# Patient Record
Sex: Female | Born: 1975 | Race: White | Hispanic: No | Marital: Married | Smoking: Never smoker
Health system: Southern US, Community
[De-identification: ages and names within clinical notes are randomized; demographics above are authoritative.]

## PROBLEM LIST (undated history)

## (undated) DIAGNOSIS — O09529 Supervision of elderly multigravida, unspecified trimester: Secondary | ICD-10-CM

## (undated) DIAGNOSIS — R51 Headache: Secondary | ICD-10-CM

## (undated) HISTORY — DX: Supervision of elderly multigravida, unspecified trimester: O09.529

## (undated) HISTORY — DX: Headache: R51

## (undated) HISTORY — PX: BLADDER SURGERY: SHX569

## (undated) HISTORY — PX: DILATION AND CURETTAGE OF UTERUS: SHX78

---

## 2004-03-10 ENCOUNTER — Ambulatory Visit (HOSPITAL_COMMUNITY): Admission: RE | Admit: 2004-03-10 | Discharge: 2004-03-10 | Payer: Self-pay | Admitting: Chiropractic Medicine

## 2005-07-11 ENCOUNTER — Other Ambulatory Visit: Admission: RE | Admit: 2005-07-11 | Discharge: 2005-07-11 | Payer: Self-pay | Admitting: Obstetrics and Gynecology

## 2010-12-19 NOTE — L&D Delivery Note (Signed)
Delivery Note At 12:08 PM a viable female was delivered via  (Presentation: ;  ).  APGAR8/9: , ; weight .   Placenta status: intact, .3 vessel  Cord:  with the following complications: .    Anesthesia:  local Episiotomy: none Lacerations: second degree Suture Repair: chromic Est. Blood Loss (mL): 400 cc  Mom to postpartum.  Baby to nursery-stable.  Sue Yoder S 12/16/2011, 12:21 PM

## 2011-05-04 LAB — HIV ANTIBODY (ROUTINE TESTING W REFLEX): HIV: NONREACTIVE

## 2011-05-04 LAB — ANTIBODY SCREEN: Antibody Screen: NEGATIVE

## 2011-05-04 LAB — GC/CHLAMYDIA PROBE AMP, GENITAL: Chlamydia: NEGATIVE

## 2011-05-04 LAB — ABO/RH: RH Type: POSITIVE

## 2011-12-08 ENCOUNTER — Encounter (HOSPITAL_COMMUNITY): Payer: Self-pay | Admitting: *Deleted

## 2011-12-08 ENCOUNTER — Telehealth (HOSPITAL_COMMUNITY): Payer: Self-pay | Admitting: *Deleted

## 2011-12-08 NOTE — Telephone Encounter (Signed)
Preadmission screen  

## 2011-12-14 ENCOUNTER — Encounter (HOSPITAL_COMMUNITY): Payer: Self-pay | Admitting: *Deleted

## 2011-12-14 ENCOUNTER — Inpatient Hospital Stay (HOSPITAL_COMMUNITY): Admit: 2011-12-14 | Payer: Self-pay | Admitting: Obstetrics and Gynecology

## 2011-12-15 ENCOUNTER — Encounter (HOSPITAL_COMMUNITY): Payer: Self-pay

## 2011-12-15 ENCOUNTER — Inpatient Hospital Stay (HOSPITAL_COMMUNITY)
Admission: RE | Admit: 2011-12-15 | Discharge: 2011-12-17 | DRG: 373 | Disposition: A | Discharge status: Home / Self Care | Payer: BC Managed Care – PPO | Source: Ambulatory Visit | Attending: Obstetrics and Gynecology | Admitting: Obstetrics and Gynecology

## 2011-12-15 DIAGNOSIS — O09529 Supervision of elderly multigravida, unspecified trimester: Secondary | ICD-10-CM | POA: Diagnosis present

## 2011-12-15 LAB — CBC
Hemoglobin: 11.3 g/dL — ABNORMAL LOW (ref 12.0–15.0)
RBC: 3.68 MIL/uL — ABNORMAL LOW (ref 3.87–5.11)

## 2011-12-15 MED ORDER — ONDANSETRON HCL 4 MG/2ML IJ SOLN: 4.0000 mg | Freq: Four times a day (QID) | INTRAMUSCULAR | Status: DC | PRN

## 2011-12-15 MED ORDER — TERBUTALINE SULFATE 1 MG/ML IJ SOLN: 0.2500 mg | Freq: Once | INTRAMUSCULAR | Status: AC | PRN

## 2011-12-15 MED ORDER — IBUPROFEN 600 MG PO TABS: 600.0000 mg | ORAL_TABLET | Freq: Four times a day (QID) | ORAL | Status: DC | PRN

## 2011-12-15 MED ORDER — LACTATED RINGERS IV SOLN: 500.0000 mL | INTRAVENOUS | Status: DC | PRN

## 2011-12-15 MED ORDER — LIDOCAINE HCL (PF) 1 % IJ SOLN
30.0000 mL | INTRAMUSCULAR | Status: DC | PRN
  Filled 2011-12-15: qty 30

## 2011-12-15 MED ORDER — OXYTOCIN 20 UNITS IN LACTATED RINGERS INFUSION - SIMPLE
125.0000 mL/h | Freq: Once | INTRAVENOUS | Status: AC
  Administered 2011-12-16: 125 mL/h via INTRAVENOUS

## 2011-12-15 MED ORDER — ACETAMINOPHEN 325 MG PO TABS: 650.0000 mg | ORAL_TABLET | ORAL | Status: DC | PRN

## 2011-12-15 MED ORDER — DINOPROSTONE 10 MG VA INST
10.0000 mg | VAGINAL_INSERT | Freq: Once | VAGINAL | Status: AC
  Administered 2011-12-15: 10 mg via VAGINAL
  Filled 2011-12-15: qty 1

## 2011-12-15 MED ORDER — LACTATED RINGERS IV SOLN
INTRAVENOUS | Status: DC
  Administered 2011-12-15 – 2011-12-16 (×2): via INTRAVENOUS

## 2011-12-15 MED ORDER — CITRIC ACID-SODIUM CITRATE 334-500 MG/5ML PO SOLN: 30.0000 mL | ORAL | Status: DC | PRN

## 2011-12-15 MED ORDER — FLEET ENEMA 7-19 GM/118ML RE ENEM: 1.0000 | ENEMA | RECTAL | Status: DC | PRN

## 2011-12-15 MED ORDER — ZOLPIDEM TARTRATE 10 MG PO TABS: 10.0000 mg | ORAL_TABLET | Freq: Every evening | ORAL | Status: DC | PRN

## 2011-12-15 MED ORDER — OXYTOCIN BOLUS FROM INFUSION
500.0000 mL | Freq: Once | INTRAVENOUS | Status: DC
  Filled 2011-12-15: qty 500

## 2011-12-15 MED ORDER — OXYCODONE-ACETAMINOPHEN 5-325 MG PO TABS: 2.0000 | ORAL_TABLET | ORAL | Status: DC | PRN

## 2011-12-15 NOTE — Progress Notes (Signed)
Made aware of pt status: uterine contraction pattern, FHT tracing, SVE and need for orders. Provider will put in orders. WIll continue to monitor.

## 2011-12-16 ENCOUNTER — Encounter (HOSPITAL_COMMUNITY): Payer: Self-pay

## 2011-12-16 LAB — RPR: RPR Ser Ql: NONREACTIVE

## 2011-12-16 LAB — RUBELLA SCREEN: Rubella: 164.1 IU/mL — ABNORMAL HIGH

## 2011-12-16 MED ORDER — BUTORPHANOL TARTRATE 2 MG/ML IJ SOLN
2.0000 mg | Freq: Once | INTRAMUSCULAR | Status: AC
  Administered 2011-12-16: 2 mg via INTRAVENOUS
  Filled 2011-12-16: qty 1

## 2011-12-16 MED ORDER — SENNOSIDES-DOCUSATE SODIUM 8.6-50 MG PO TABS
2.0000 | ORAL_TABLET | Freq: Every day | ORAL | Status: DC
  Administered 2011-12-16: 2 via ORAL

## 2011-12-16 MED ORDER — SIMETHICONE 80 MG PO CHEW: 80.0000 mg | CHEWABLE_TABLET | ORAL | Status: DC | PRN

## 2011-12-16 MED ORDER — OXYTOCIN 20 UNITS IN LACTATED RINGERS INFUSION - SIMPLE
1.0000 m[IU]/min | INTRAVENOUS | Status: DC
  Administered 2011-12-16: 2 m[IU]/min via INTRAVENOUS
  Filled 2011-12-16: qty 1000

## 2011-12-16 MED ORDER — BENZOCAINE-MENTHOL 20-0.5 % EX AERO
1.0000 "application " | INHALATION_SPRAY | CUTANEOUS | Status: DC | PRN
  Administered 2011-12-16: 1 via TOPICAL

## 2011-12-16 MED ORDER — ONDANSETRON HCL 4 MG/2ML IJ SOLN: 4.0000 mg | INTRAMUSCULAR | Status: DC | PRN

## 2011-12-16 MED ORDER — TERBUTALINE SULFATE 1 MG/ML IJ SOLN: 0.2500 mg | Freq: Once | INTRAMUSCULAR | Status: DC | PRN

## 2011-12-16 MED ORDER — TETANUS-DIPHTH-ACELL PERTUSSIS 5-2.5-18.5 LF-MCG/0.5 IM SUSP: 0.5000 mL | Freq: Once | INTRAMUSCULAR | Status: DC

## 2011-12-16 MED ORDER — FLEET ENEMA 7-19 GM/118ML RE ENEM: 1.0000 | ENEMA | Freq: Every day | RECTAL | Status: DC | PRN

## 2011-12-16 MED ORDER — PROMETHAZINE HCL 25 MG/ML IJ SOLN
12.5000 mg | Freq: Once | INTRAMUSCULAR | Status: AC
  Administered 2011-12-16: 12.5 mg via INTRAVENOUS
  Filled 2011-12-16: qty 1

## 2011-12-16 MED ORDER — LANOLIN HYDROUS EX OINT: TOPICAL_OINTMENT | CUTANEOUS | Status: DC | PRN

## 2011-12-16 MED ORDER — OXYCODONE-ACETAMINOPHEN 5-325 MG PO TABS: 1.0000 | ORAL_TABLET | ORAL | Status: DC | PRN

## 2011-12-16 MED ORDER — ZOLPIDEM TARTRATE 5 MG PO TABS: 5.0000 mg | ORAL_TABLET | Freq: Every evening | ORAL | Status: DC | PRN

## 2011-12-16 MED ORDER — IBUPROFEN 600 MG PO TABS
600.0000 mg | ORAL_TABLET | Freq: Four times a day (QID) | ORAL | Status: DC
  Administered 2011-12-16 – 2011-12-17 (×4): 600 mg via ORAL
  Filled 2011-12-16 (×4): qty 1

## 2011-12-16 MED ORDER — WITCH HAZEL-GLYCERIN EX PADS
1.0000 "application " | MEDICATED_PAD | CUTANEOUS | Status: DC | PRN
  Administered 2011-12-16: 1 via TOPICAL

## 2011-12-16 MED ORDER — DIBUCAINE 1 % RE OINT: 1.0000 "application " | TOPICAL_OINTMENT | RECTAL | Status: DC | PRN

## 2011-12-16 MED ORDER — PRENATAL MULTIVITAMIN CH
1.0000 | ORAL_TABLET | Freq: Every day | ORAL | Status: DC
  Administered 2011-12-16 – 2011-12-17 (×2): 1 via ORAL
  Filled 2011-12-16 (×2): qty 1

## 2011-12-16 MED ORDER — BISACODYL 10 MG RE SUPP: 10.0000 mg | Freq: Every day | RECTAL | Status: DC | PRN

## 2011-12-16 MED ORDER — ONDANSETRON HCL 4 MG PO TABS: 4.0000 mg | ORAL_TABLET | ORAL | Status: DC | PRN

## 2011-12-16 MED ORDER — BUTORPHANOL TARTRATE 2 MG/ML IJ SOLN
1.0000 mg | Freq: Once | INTRAMUSCULAR | Status: AC
  Administered 2011-12-16: 1 mg via INTRAVENOUS
  Filled 2011-12-16: qty 1

## 2011-12-16 MED ORDER — BENZOCAINE-MENTHOL 20-0.5 % EX AERO
INHALATION_SPRAY | CUTANEOUS | Status: AC
  Administered 2011-12-16: 1 via TOPICAL
  Filled 2011-12-16: qty 56

## 2011-12-16 MED ORDER — DIPHENHYDRAMINE HCL 25 MG PO CAPS: 25.0000 mg | ORAL_CAPSULE | Freq: Four times a day (QID) | ORAL | Status: DC | PRN

## 2011-12-16 NOTE — Progress Notes (Signed)
MD made aware of pt status: uterine contractions, FHT tracing, SVE and cervidil in place. New order given. Will continue to monitor.

## 2011-12-16 NOTE — H&P (Signed)
Sue Yoder is a 35 y.o. female presenting at 18.2 for induction.  cervidyl last pm.  GBS neg. Maternal Medical History:  Prenatal Complications - Diabetes: none.    OB History    Grav Para Term Preterm Abortions TAB SAB Ect Mult Living   4 1 1  2  2   1      Past Medical History  Diagnosis Date  . Headache   . AMA (advanced maternal age) multigravida 35+    Past Surgical History  Procedure Date  . Dilation and curettage of uterus    Family History: family history includes Anemia in her maternal grandmother; Cancer in her father; and Heart disease in her mother. Social History:  reports that she has never smoked. She has never used smokeless tobacco. She reports that she does not drink alcohol or use illicit drugs.  ROS  Dilation: 4 Effacement (%): 100 Station: -1 Exam by:: Hannah Beat Blood pressure 106/54, pulse 83, temperature 98.3 F (36.8 C), temperature source Oral, resp. rate 18, height 5\' 7"  (1.702 m), weight 81.194 kg (179 lb), last menstrual period 03/09/2011. Maternal Exam:  Uterine Assessment: Contraction strength is moderate.  Contraction frequency is regular.   Abdomen: Patient reports no abdominal tenderness. Fundal height is c/w dates.   Estimated fetal weight is 7.   Fetal presentation: vertex  Introitus: Amniotic fluid character: clear.  Pelvis: adequate for delivery.   Cervix: Cervix evaluated by digital exam.     Physical Exam  Prenatal labs: ABO, Rh: O/Positive/-- (05/16 0000) Antibody: Negative (05/16 0000) Rubella: 164.1 (12/27 2010) RPR: NON REACTIVE (12/27 2010)  HBsAg: Negative (05/16 0000)  HIV: Non-reactive (05/16 0000)  GBS: Negative (11/28 0000)   Assessment/Plan:IUP at 40 + for inducion.  AROM willl follow ctx pattern   Sher Hellinger S 12/16/2011, 7:36 AM

## 2011-12-16 NOTE — Progress Notes (Signed)
UR Chart review completed.  

## 2011-12-16 NOTE — Progress Notes (Signed)
Patient ID: Sue Yoder, female   DOB: July 04, 1976, 35 y.o.   MRN: [id number] Mild irreg ctx's begin pitocin risk discussed

## 2011-12-17 LAB — CBC
Hemoglobin: 9.8 g/dL — ABNORMAL LOW (ref 12.0–15.0)
MCHC: 33 g/dL (ref 30.0–36.0)
RDW: 12.7 % (ref 11.5–15.5)
WBC: 11.4 10*3/uL — ABNORMAL HIGH (ref 4.0–10.5)

## 2011-12-17 MED ORDER — PRENATAL MULTIVITAMIN CH: 1.0000 | ORAL_TABLET | Freq: Every day | ORAL | Status: DC

## 2011-12-17 NOTE — Progress Notes (Signed)
Post Partum Day one Subjective: no complaints  Objective: Blood pressure 86/48, pulse 70, temperature 98.8 F (37.1 C), temperature source Oral, resp. rate 20, height 5\' 7"  (1.702 m), weight 81.194 kg (179 lb), last menstrual period 03/09/2011, unknown if currently breastfeeding.  Physical Exam:  General: alert Lochia: appropriate Uterine Fundus: firm Incision: healing well DVT Evaluation: No evidence of DVT seen on physical exam.   Basename 12/17/11 0530 12/15/11 2010  HGB 9.8* 11.3*  HCT 29.7* 33.4*    Assessment/Plan: Discharge home   LOS: 2 days   Sue Yoder S 12/17/2011, 9:21 AM

## 2011-12-17 NOTE — Discharge Summary (Signed)
Obstetric Discharge Summary Reason for Admission: induction of labor Prenatal Procedures:  ultrasouds Intrapartum Procedures: spontaneous vaginal delivery Postpartum Procedures: none Complications-Operative and Postpartum: second degree perineal laceration Hemoglobin  Date Value Range Status  12/17/2011 9.8* 12.0-15.0 (g/dL) Final     HCT  Date Value Range Status  12/17/2011 29.7* 36.0-46.0 (%) Final    Discharge Diagnoses: Term Pregnancy-delivered  Discharge Information: Date: 12/17/2011 Activity: pelvic rest Diet: routine Medications: PNV Condition: stable Instructions: refer to practice specific booklet Discharge to: home   Newborn Data: Live born female  Birth Weight: 7 lb 15.5 oz (3615 g) APGAR: 9, 9  Home with mother.  Farren Landa S 12/17/2011, 9:21 AM

## 2012-01-27 ENCOUNTER — Ambulatory Visit (HOSPITAL_COMMUNITY)
Admission: RE | Admit: 2012-01-27 | Discharge: 2012-01-27 | Disposition: A | Discharge status: Home / Self Care | Payer: BC Managed Care – PPO | Source: Ambulatory Visit | Attending: Obstetrics and Gynecology | Admitting: Obstetrics and Gynecology

## 2012-01-27 NOTE — Progress Notes (Signed)
Adult Lactation Consultation Outpatient Visit Note  Patient Name: Sue Yoder Date of Birth: March 24, 1976 Gestational Age at Delivery: Unknown Type of Delivery:   Patient is here today related to thrush. She has been using all purpose nipple cream for about three weeks and now she reports shooting pain in her breasts bilaterally that is radiating out.  Her breasts are also throbbing 2 times a day and her nipples are bright pink and burn.  Dr. Rana Snare prescribed diflucan for her today x 7 days.  She denies any nipple discoloration after feeding.  There is minimal pain with latching that resolves quickly.  Advised to start the diflucan and start using sore nipple shells.  Thrush care plan was initiated.  She will notify healthcare provider if there is no change in symptoms in 5 days.  If she does notice an improvement but the pain is present in 7 days she will also follow up with her doctor.    Amount Transferred: Comments:     Follow-Up      Sue Yoder 01/27/2012, 4:07 PM

## 2014-10-20 ENCOUNTER — Encounter (HOSPITAL_COMMUNITY): Payer: Self-pay

## 2017-08-29 ENCOUNTER — Encounter: Payer: Self-pay | Admitting: Allergy and Immunology

## 2017-08-29 ENCOUNTER — Ambulatory Visit (INDEPENDENT_AMBULATORY_CARE_PROVIDER_SITE_OTHER): Payer: 59 | Admitting: Allergy and Immunology

## 2017-08-29 VITALS — BP 108/72 | HR 75 | Temp 98.2°F | Resp 18 | Ht 65.5 in | Wt 137.4 lb

## 2017-08-29 DIAGNOSIS — L500 Allergic urticaria: Secondary | ICD-10-CM | POA: Insufficient documentation

## 2017-08-29 DIAGNOSIS — Z889 Allergy status to unspecified drugs, medicaments and biological substances status: Secondary | ICD-10-CM

## 2017-08-29 NOTE — Patient Instructions (Addendum)
Urticaria/drug reaction Drug reaction from ciprofloxacin is most likely based upon the timing of symptom onset. Skin tests to select environmental and food allergens were negative today. NSAIDs and emotional stress commonly exacerbate urticaria but are not the underlying etiology in this case. Physical urticarias are negative by history (i.e. pressure-induced, temperature, vibration, solar, etc.). There are no concomitant symptoms concerning for anaphylaxis.   We will not order labs at this time, however, if lesions recur, persist, progress, or change in character, we will assess potential etiologies with screening labs.  For symptom relief, patient is to take oral antihistamines as directed.  For now, avoid ciprofloxacin another fluoroquinolones for now.  Should symptoms recur in the absence of ciprofloxacin or other floor quinolones, a journal is to be kept recording any foods eaten, beverages consumed, medications taken within a 6 hour period prior to the onset of symptoms, as well as record activities being performed, and environmental conditions. For any symptoms concerning for anaphylaxis, 911 is to be called immediately.   Return if symptoms worsen or fail to improve.

## 2017-08-29 NOTE — Assessment & Plan Note (Signed)
Drug reaction from ciprofloxacin is most likely based upon the timing of symptom onset. Skin tests to select environmental and food allergens were negative today. NSAIDs and emotional stress commonly exacerbate urticaria but are not the underlying etiology in this case. Physical urticarias are negative by history (i.e. pressure-induced, temperature, vibration, solar, etc.). There are no concomitant symptoms concerning for anaphylaxis.   We will not order labs at this time, however, if lesions recur, persist, progress, or change in character, we will assess potential etiologies with screening labs.  For symptom relief, patient is to take oral antihistamines as directed.  For now, avoid ciprofloxacin another fluoroquinolones for now.  Should symptoms recur in the absence of ciprofloxacin or other floor quinolones, a journal is to be kept recording any foods eaten, beverages consumed, medications taken within a 6 hour period prior to the onset of symptoms, as well as record activities being performed, and environmental conditions. For any symptoms concerning for anaphylaxis, 911 is to be called immediately.

## 2017-08-29 NOTE — Progress Notes (Signed)
New Patient Note  RE: Sue Yoder MRN: [id number] DOB: [birth year] Date of Office Visit: 08/29/2017  Referring provider: Clayborn Heron, MD Primary care provider: Clayborn Heron, MD  Chief Complaint: Urticaria   History of present illness: Sue Yoder is a 41 y.o. female presenting today for evaluation of urticaria.  She reports that on 06/28/2017 she developed hives on her abdomen, flanks, and upper thighs.  The hives are described as red, raised, nonpruritic, and nonpainful.  2 days prior to the onset of hives she had gone in for a urodynamic study and received a dose of ciprofloxacin.  5 days prior to the onset of the hives she had returned from a trip to [place].  Otherwise, no specific medication, food, skin care product, detergent, soap, or other environmental triggers were identified.  She did not experience concomitant angioedema, cardiopulmonary symptoms, or GI symptoms.  She went to her primary care physician and was told to take oral antihistamines, however with the hives persisted she was started on a course of prednisone.  The hives resolved with prednisone, however approximately one week later recurred.  She was started on another antibiotic for possible urinary tract infection as well as cetirizine and the hives resolved over the course of the next week.  The hives have not recurred since that time.  Urine culture came back negative for bacteria.   Assessment and plan: Urticaria/drug reaction Drug reaction from ciprofloxacin is most likely based upon the timing of symptom onset. Skin tests to select environmental and food allergens were negative today. NSAIDs and emotional stress commonly exacerbate urticaria but are not the underlying etiology in this case. Physical urticarias are negative by history (i.e. pressure-induced, temperature, vibration, solar, etc.). There are no concomitant symptoms concerning for anaphylaxis.   We will not order labs at this time,  however, if lesions recur, persist, progress, or change in character, we will assess potential etiologies with screening labs.  For symptom relief, patient is to take oral antihistamines as directed.  For now, avoid ciprofloxacin another fluoroquinolones for now.  Should symptoms recur in the absence of ciprofloxacin or other floor quinolones, a journal is to be kept recording any foods eaten, beverages consumed, medications taken within a 6 hour period prior to the onset of symptoms, as well as record activities being performed, and environmental conditions. For any symptoms concerning for anaphylaxis, 911 is to be called immediately.   Diagnostics: Environmental skin testing:  Negative despite a positive histamine control. Food allergen skin testing:  Negative despite a positive histamine control.    Physical examination: Blood pressure 108/72, pulse 75, temperature 98.2 F (36.8 C), resp. rate 18, height 5' 5.5" (1.664 m), weight 137 lb 6.4 oz (62.3 kg), SpO2 98 %, unknown if currently breastfeeding.  General: Alert, interactive, in no acute distress. HEENT: TMs pearly gray, turbinates minimally edematous without discharge, post-pharynx unremarkable. Neck: Supple without lymphadenopathy. Lungs: Clear to auscultation without wheezing, rhonchi or rales. CV: Normal S1, S2 without murmurs. Abdomen: Nondistended, nontender. Skin: Warm and dry, without lesions or rashes. Extremities:  No clubbing, cyanosis or edema. Neuro:   Grossly intact.  Review of systems:  Review of systems negative except as noted in HPI / PMHx or noted below: Review of Systems  Constitutional: Negative.   HENT: Negative.   Eyes: Negative.   Respiratory: Negative.   Cardiovascular: Negative.   Gastrointestinal: Negative.   Genitourinary: Negative.   Musculoskeletal: Negative.   Skin: Negative.   Neurological: Negative.  Endo/Heme/Allergies: Negative.   Psychiatric/Behavioral: Negative.     Past medical  history:  Past Medical History:  Diagnosis Date  . AMA (advanced maternal age) multigravida 35+   . ZOXWRUEA(540.9Headache(784.0)     Past surgical history:  Past Surgical History:  Procedure Laterality Date  . DILATION AND CURETTAGE OF UTERUS      Family history: Family History  Problem Relation Age of Onset  . Heart disease Mother   . Cancer Father        prostate and esophageal  . Anemia Maternal Grandmother     Social history: Social History   Social History  . Marital status: Married    Spouse name: N/A  . Number of children: N/A  . Years of education: N/A   Occupational History  . Not on file.   Social History Main Topics  . Smoking status: Never Smoker  . Smokeless tobacco: Never Used  . Alcohol use No  . Drug use: No  . Sexual activity: Yes   Other Topics Concern  . Not on file   Social History Narrative  . No narrative on file   Environmental History: The patient lives in a 43 year old house with carpeting throughout, gas heat, and central air.  She is a nonsmoker without pets.  There is no known mold/water damage in the home.  Allergies as of 08/29/2017   No Known Allergies     Medication List       Accurate as of 08/29/17  2:54 PM. Always use your most recent med list.          acetaminophen 325 MG tablet Commonly known as:  TYLENOL Take 650 mg by mouth every 6 (six) hours as needed. Head ache   MYRBETRIQ PO Take by mouth.       Known medication allergies: No Known Allergies  I [facility] the opportunity [facility]. Please do not hesitate to contact me with questions.  Sincerely,   R. Jorene Guestarter Turki Tapanes, MD

## 2018-02-07 ENCOUNTER — Emergency Department (HOSPITAL_BASED_OUTPATIENT_CLINIC_OR_DEPARTMENT_OTHER): Payer: 59

## 2018-02-07 ENCOUNTER — Inpatient Hospital Stay (HOSPITAL_BASED_OUTPATIENT_CLINIC_OR_DEPARTMENT_OTHER)
Admission: EM | Admit: 2018-02-07 | Discharge: 2018-02-09 | DRG: 195 | Disposition: A | Discharge status: Home / Self Care | Payer: 59 | Attending: Family Medicine | Admitting: Family Medicine

## 2018-02-07 ENCOUNTER — Encounter (HOSPITAL_BASED_OUTPATIENT_CLINIC_OR_DEPARTMENT_OTHER): Payer: Self-pay | Admitting: Emergency Medicine

## 2018-02-07 ENCOUNTER — Other Ambulatory Visit: Payer: Self-pay

## 2018-02-07 DIAGNOSIS — J1100 Influenza due to unidentified influenza virus with unspecified type of pneumonia: Secondary | ICD-10-CM | POA: Diagnosis not present

## 2018-02-07 DIAGNOSIS — Z800 Family history of malignant neoplasm of digestive organs: Secondary | ICD-10-CM

## 2018-02-07 DIAGNOSIS — Z8042 Family history of malignant neoplasm of prostate: Secondary | ICD-10-CM

## 2018-02-07 DIAGNOSIS — J9601 Acute respiratory failure with hypoxia: Secondary | ICD-10-CM | POA: Diagnosis present

## 2018-02-07 DIAGNOSIS — Z881 Allergy status to other antibiotic agents status: Secondary | ICD-10-CM | POA: Diagnosis not present

## 2018-02-07 DIAGNOSIS — G43909 Migraine, unspecified, not intractable, without status migrainosus: Secondary | ICD-10-CM | POA: Diagnosis present

## 2018-02-07 DIAGNOSIS — Z883 Allergy status to other anti-infective agents status: Secondary | ICD-10-CM

## 2018-02-07 DIAGNOSIS — J181 Lobar pneumonia, unspecified organism: Secondary | ICD-10-CM | POA: Diagnosis present

## 2018-02-07 DIAGNOSIS — R0902 Hypoxemia: Secondary | ICD-10-CM | POA: Diagnosis present

## 2018-02-07 DIAGNOSIS — J189 Pneumonia, unspecified organism: Secondary | ICD-10-CM | POA: Diagnosis present

## 2018-02-07 LAB — CBC WITH DIFFERENTIAL/PLATELET
Basophils Absolute: 0 10*3/uL (ref 0.0–0.1)
Basophils Relative: 0 %
Eosinophils Absolute: 0.1 10*3/uL (ref 0.0–0.7)
Eosinophils Relative: 0 %
HEMATOCRIT: 33.1 % — AB (ref 36.0–46.0)
HEMOGLOBIN: 10.9 g/dL — AB (ref 12.0–15.0)
LYMPHS PCT: 7 %
Lymphs Abs: 1.1 10*3/uL (ref 0.7–4.0)
MCH: 29.8 pg (ref 26.0–34.0)
MCHC: 32.9 g/dL (ref 30.0–36.0)
MCV: 90.4 fL (ref 78.0–100.0)
MONOS PCT: 6 %
Monocytes Absolute: 0.9 10*3/uL (ref 0.1–1.0)
NEUTROS ABS: 12.1 10*3/uL — AB (ref 1.7–7.7)
NEUTROS PCT: 87 %
Platelets: 231 10*3/uL (ref 150–400)
RBC: 3.66 MIL/uL — ABNORMAL LOW (ref 3.87–5.11)
RDW: 11.7 % (ref 11.5–15.5)
WBC: 14.2 10*3/uL — ABNORMAL HIGH (ref 4.0–10.5)

## 2018-02-07 LAB — BASIC METABOLIC PANEL
ANION GAP: 10 (ref 5–15)
BUN: 12 mg/dL (ref 6–20)
CALCIUM: 8.6 mg/dL — AB (ref 8.9–10.3)
CHLORIDE: 101 mmol/L (ref 101–111)
CO2: 25 mmol/L (ref 22–32)
Creatinine, Ser: 0.8 mg/dL (ref 0.44–1.00)
GFR calc Af Amer: >60 mL/min (ref >60)
GFR calc non Af Amer: >60 mL/min (ref >60)
GLUCOSE: 103 mg/dL — AB (ref 65–99)
Potassium: 4.1 mmol/L (ref 3.5–5.1)
Sodium: 136 mmol/L (ref 135–145)

## 2018-02-07 LAB — I-STAT CG4 LACTIC ACID, ED: Lactic Acid, Venous: 0.54 mmol/L (ref 0.5–1.9)

## 2018-02-07 MED ORDER — SODIUM CHLORIDE 0.9 % IV BOLUS (SEPSIS)
1000.0000 mL | Freq: Once | INTRAVENOUS | Status: AC
  Administered 2018-02-07: 1000 mL via INTRAVENOUS

## 2018-02-07 MED ORDER — VANCOMYCIN HCL IN DEXTROSE 1-5 GM/200ML-% IV SOLN
1000.0000 mg | Freq: Once | INTRAVENOUS | Status: AC
  Administered 2018-02-07: 1000 mg via INTRAVENOUS
  Filled 2018-02-07: qty 200

## 2018-02-07 MED ORDER — DIPHENHYDRAMINE HCL 50 MG/ML IJ SOLN
25.0000 mg | Freq: Once | INTRAMUSCULAR | Status: AC
  Administered 2018-02-07: 25 mg via INTRAVENOUS
  Filled 2018-02-07: qty 1

## 2018-02-07 MED ORDER — ENOXAPARIN SODIUM 40 MG/0.4ML ~~LOC~~ SOLN
40.0000 mg | SUBCUTANEOUS | Status: DC
  Administered 2018-02-07 – 2018-02-08 (×2): 40 mg via SUBCUTANEOUS
  Filled 2018-02-07 (×2): qty 0.4

## 2018-02-07 MED ORDER — SODIUM CHLORIDE 0.9 % IV SOLN
2.0000 g | Freq: Once | INTRAVENOUS | Status: AC
  Administered 2018-02-07: 2 g via INTRAVENOUS
  Filled 2018-02-07: qty 20

## 2018-02-07 MED ORDER — SODIUM CHLORIDE 0.9 % IV SOLN
INTRAVENOUS | Status: DC
  Administered 2018-02-07 – 2018-02-09 (×3): via INTRAVENOUS

## 2018-02-07 MED ORDER — ACETAMINOPHEN 500 MG PO TABS
1000.0000 mg | ORAL_TABLET | Freq: Once | ORAL | Status: AC
  Administered 2018-02-07: 1000 mg via ORAL
  Filled 2018-02-07: qty 2

## 2018-02-07 MED ORDER — DOXYCYCLINE HYCLATE 100 MG PO TABS
100.0000 mg | ORAL_TABLET | Freq: Two times a day (BID) | ORAL | Status: DC
  Administered 2018-02-07 – 2018-02-09 (×4): 100 mg via ORAL
  Filled 2018-02-07 (×4): qty 1

## 2018-02-07 MED ORDER — SODIUM CHLORIDE 0.9 % IV SOLN
1.0000 g | INTRAVENOUS | Status: DC
  Administered 2018-02-08: 1 g via INTRAVENOUS
  Filled 2018-02-07 (×2): qty 10

## 2018-02-07 MED ORDER — PROCHLORPERAZINE EDISYLATE 5 MG/ML IJ SOLN
10.0000 mg | Freq: Once | INTRAMUSCULAR | Status: AC
  Administered 2018-02-07: 10 mg via INTRAVENOUS
  Filled 2018-02-07: qty 2

## 2018-02-07 MED ORDER — DEXAMETHASONE SODIUM PHOSPHATE 10 MG/ML IJ SOLN
10.0000 mg | Freq: Once | INTRAMUSCULAR | Status: AC
  Administered 2018-02-07: 10 mg via INTRAVENOUS
  Filled 2018-02-07: qty 1

## 2018-02-07 MED ORDER — KETOROLAC TROMETHAMINE 15 MG/ML IJ SOLN
15.0000 mg | Freq: Once | INTRAMUSCULAR | Status: AC
  Administered 2018-02-07: 15 mg via INTRAVENOUS
  Filled 2018-02-07: qty 1

## 2018-02-07 MED ORDER — PREDNISONE 20 MG PO TABS
40.0000 mg | ORAL_TABLET | Freq: Every day | ORAL | Status: DC
  Administered 2018-02-08 – 2018-02-09 (×2): 40 mg via ORAL
  Filled 2018-02-07 (×2): qty 2

## 2018-02-07 MED ORDER — ACETAMINOPHEN 325 MG PO TABS
650.0000 mg | ORAL_TABLET | Freq: Four times a day (QID) | ORAL | Status: DC | PRN
  Administered 2018-02-08 (×3): 650 mg via ORAL
  Filled 2018-02-07 (×3): qty 2

## 2018-02-07 NOTE — ED Provider Notes (Signed)
[facility] EMERGENCY DEPARTMENT Provider Note   CSN: [id number] Arrival date & time: 02/07/18  0906     History   Chief Complaint Chief Complaint  Patient presents with  . Influenza    HPI Sue Yoder is a 42 y.o. female.  HPI   42 year old female here with weakness and cough.  The patient was recent diagnosed with influenza last week.  She completed a course of Tamiflu.  She states that over the last several days, she is developed recurrence of fevers, cough, nausea, and fatigue.  She had difficulty eating and drinking.  She said associated moderate, aching, throbbing headache.  She has a history of migraines and this is not atypical for her.  She has had progressive worsening shortness of breath as well.  She subsequent presents for evaluation.  She was placed on azithromycin several days ago by her PCP, but this has not improved her symptoms.  Denies any abdominal pain.  Denies any neck stiffness or rigidity.  No photophobia.  Past Medical History:  Diagnosis Date  . AMA (advanced maternal age) multigravida 35+   . ONGEXBMW(413.2Headache(784.0)     Patient Active Problem List   Diagnosis Date Noted  . PNA (pneumonia) 02/07/2018  . Urticaria/drug reaction 08/29/2017  . Drug allergy 08/29/2017    Past Surgical History:  Procedure Laterality Date  . BLADDER SURGERY    . DILATION AND CURETTAGE OF UTERUS      OB History    Gravida Para Term Preterm AB Living   4 2 2   2 2    SAB TAB Ectopic Multiple Live Births   2       2       Home Medications    Prior to Admission medications   Medication Sig Start Date End Date Taking? Authorizing Provider  azithromycin (ZITHROMAX) 250 MG tablet Take 250 mg by mouth daily.   Yes [provider]  ondansetron (ZOFRAN) 4 MG tablet Take 4 mg by mouth every 8 (eight) hours as needed for nausea or vomiting.   Yes [provider]  acetaminophen (TYLENOL) 325 MG tablet Take 650 mg by mouth every 6 (six) hours as  needed. Head ache     [provider]  Mirabegron (MYRBETRIQ PO) Take by mouth.    [provider]    Family History Family History  Problem Relation Age of Onset  . Heart disease Mother   . Cancer Father        prostate and esophageal  . Anemia Maternal Grandmother     Social History Social History   Tobacco Use  . Smoking status: Never Smoker  . Smokeless tobacco: Never Used  Substance Use Topics  . Alcohol use: No  . Drug use: No     Allergies   Ciprofloxacin; Erythromycin; and Septra [sulfamethoxazole-trimethoprim]   Review of Systems Review of Systems  Constitutional: Positive for chills and fever.  Respiratory: Positive for cough and shortness of breath.   Neurological: Positive for weakness.  All other systems reviewed and are negative.    Physical Exam Updated Vital Signs BP 97/73 (BP Location: Left Arm)   Pulse 84   Temp 98.7 F (37.1 C) (Oral)   Resp 18   Ht 5\' 7"  (1.702 m)   Wt 59.4 kg (131 lb)   LMP 01/23/2018   SpO2 96%   BMI 20.52 kg/m   Physical Exam  Constitutional: She is oriented to person, place, and time. She appears well-developed and  well-nourished. No distress.  HENT:  Head: Normocephalic and atraumatic.  Eyes: Conjunctivae are normal. Pupils are equal, round, and reactive to light.  Neck: Neck supple.  Cardiovascular: Normal rate, regular rhythm and normal heart sounds. Exam reveals no friction rub.  No murmur heard. Pulmonary/Chest: Effort normal. No respiratory distress. She has no wheezes. She has rales (Right basilar).  Abdominal: She exhibits no distension.  Musculoskeletal: She exhibits no edema.  Neurological: She is alert and oriented to person, place, and time. She exhibits normal muscle tone.  Skin: Skin is warm. Capillary refill takes less than 2 seconds.  Psychiatric: She has a normal mood and affect.  Nursing note and vitals reviewed.    ED Treatments / Results  Labs (all labs ordered are  listed, but only abnormal results are displayed) Labs Reviewed  CBC WITH DIFFERENTIAL/PLATELET - Abnormal; Notable for the following components:      Result Value   WBC 14.2 (*)    RBC 3.66 (*)    Hemoglobin 10.9 (*)    HCT 33.1 (*)    Neutro Abs 12.1 (*)    All other components within normal limits  BASIC METABOLIC PANEL - Abnormal; Notable for the following components:   Glucose, Bld 103 (*)    Calcium 8.6 (*)    All other components within normal limits  CULTURE, BLOOD (ROUTINE X 2)  CULTURE, BLOOD (ROUTINE X 2)  LACTIC ACID, PLASMA  LACTIC ACID, PLASMA  I-STAT CG4 LACTIC ACID, ED    EKG  EKG Interpretation None       Radiology Dg Chest 2 View  Result Date: 02/07/2018 CLINICAL DATA:  Cough. EXAM: CHEST  2 VIEW COMPARISON:  None. FINDINGS: Peripheral airspace opacity in the right lower lobe noted. The lungs appear otherwise clear. Cardiac and mediastinal margins appear normal. No bony abnormality observed. IMPRESSION: 1. Right lower lobe airspace opacity is presumably pneumonia given the patient's history. Followup PA and lateral chest X-ray is recommended in 3-4 weeks following trial of antibiotic therapy to ensure resolution and exclude underlying malignancy. Electronically Signed   By: Gaylyn Rong M.D.   On: 02/07/2018 10:57    Procedures Procedures (including critical care time)  Medications Ordered in ED Medications  sodium chloride 0.9 % bolus 1,000 mL (1,000 mLs Intravenous New Bag/Given 02/07/18 1500)  vancomycin (VANCOCIN) IVPB 1000 mg/200 mL premix (1,000 mg Intravenous New Bag/Given 02/07/18 1453)  sodium chloride 0.9 % bolus 1,000 mL (0 mLs Intravenous Stopped 02/07/18 1230)  prochlorperazine (COMPAZINE) injection 10 mg (10 mg Intravenous Given 02/07/18 1034)  diphenhydrAMINE (BENADRYL) injection 25 mg (25 mg Intravenous Given 02/07/18 1035)  ketorolac (TORADOL) 15 MG/ML injection 15 mg (15 mg Intravenous Given 02/07/18 1035)  cefTRIAXone (ROCEPHIN) 2 g in  sodium chloride 0.9 % 100 mL IVPB (0 g Intravenous Stopped 02/07/18 1230)  dexamethasone (DECADRON) injection 10 mg (10 mg Intravenous Given 02/07/18 1154)  acetaminophen (TYLENOL) tablet 1,000 mg (1,000 mg Oral Given 02/07/18 1158)     Initial Impression / Assessment and Plan / ED Course  I have reviewed the triage vital signs and the nursing notes.  Pertinent labs & imaging results that were available during my care of the patient were reviewed by me and considered in my medical decision making (see chart for details).     42 year old female here with headache, generalized weakness, cough, and fatigue after recent influenza.  Patient x-ray shows right middle lobe pneumonia.  While her headache is significantly improved with migraine meds and she has  a history of headache, she is hypoxic on ambulating despite fluids and antibiotics.  Will admit for IV antibiotics.  Given recent influenza, patient was given vancomycin and Rocephin.  She has already been on azithromycin. Blood cultures ordered, but please note these were after she has already been on antibiotics as an outpatient  Final Clinical Impressions(s) / ED Diagnoses   Final diagnoses:  Pneumonia and influenza  Hypoxia    ED Discharge Orders    None       Shaune Pollack, MD 02/07/18 [phone]

## 2018-02-07 NOTE — ED Notes (Signed)
[facility] arrived for transport, floor states bed not ready. In computer bed marked ready, [facility] called to verify. Bed not ready

## 2018-02-07 NOTE — ED Notes (Signed)
Pt was already ambulated RN Fannie KneeSue

## 2018-02-07 NOTE — ED Notes (Signed)
Up to BR w/o difficulty

## 2018-02-07 NOTE — ED Notes (Signed)
Pt eating a frozen meal (chicken parmesan) and drinking water, ok per [employer].

## 2018-02-07 NOTE — ED Notes (Signed)
Report given to [facility]David,RN  [facility]

## 2018-02-07 NOTE — H&P (Signed)
History and Physical    Sue Yoder ZOX:[id number] DOB: 31-Aug-1976 DOA: 02/07/2018  PCP: Clayborn Heron, MD  Patient coming from: Home  I have personally briefly reviewed patient's old medical records in [facility]  Chief Complaint: Cough  HPI: Sue Yoder is a 42 y.o. female with medical history significant of Influenza last week.  Finished tamiflu.  Symptoms had been getting better but then over course of last couple of days developed recurrent fever, cough, nausea, fatigue.  Headache (does have h/o migraines).  Progressively worsening SOB.  Put on azithromycin a couple of days ago by PCP but that didn't help much.  No neck stiffness.   ED Course: RLL PNA on CXR.  WBC 14k.  Given rocephin and vanc.   Review of Systems: As per HPI otherwise 10 point review of systems negative.   Past Medical History:  Diagnosis Date  . AMA (advanced maternal age) multigravida 35+   . [id number](295.6)     Past Surgical History:  Procedure Laterality Date  . BLADDER SURGERY    . DILATION AND CURETTAGE OF UTERUS       reports that  has never smoked. she has never used smokeless tobacco. She reports that she does not drink alcohol or use drugs.  Allergies  Allergen Reactions  . Ciprofloxacin   . Erythromycin   . Septra [Sulfamethoxazole-Trimethoprim]     Family History  Problem Relation Age of Onset  . Heart disease Mother   . Cancer Father        prostate and esophageal  . Anemia Maternal Grandmother      Prior to Admission medications   Medication Sig Start Date End Date Taking? Authorizing Provider  azithromycin (ZITHROMAX) 250 MG tablet Take 250 mg by mouth daily.   Yes [provider]  ondansetron (ZOFRAN) 4 MG tablet Take 4 mg by mouth every 8 (eight) hours as needed for nausea or vomiting.   Yes [provider]  acetaminophen (TYLENOL) 325 MG tablet Take 650 mg by mouth every 6 (six) hours as needed. Head ache     [provider]   Mirabegron (MYRBETRIQ PO) Take by mouth.    [provider]    Physical Exam: Vitals:   02/07/18 1440 02/07/18 1442 02/07/18 1654 02/07/18 1908  BP: 97/73 97/73 92/67  98/65  Pulse: 83 84 89 89  Resp:  18 16 18   Temp:   98.3 F (36.8 C) 98 F (36.7 C)  TempSrc:   Oral Oral  SpO2: 96% 96% 99% 96%  Weight:      Height:        Constitutional: NAD, calm, comfortable Eyes: PERRL, lids and conjunctivae normal ENMT: Mucous membranes are moist. Posterior pharynx clear of any exudate or lesions.Normal dentition.  Neck: normal, supple, no masses, no thyromegaly Respiratory: clear to auscultation bilaterally, no wheezing, no crackles. Normal respiratory effort. No accessory muscle use.  Cardiovascular: Regular rate and rhythm, no murmurs / rubs / gallops. No extremity edema. 2+ pedal pulses. No carotid bruits.  Abdomen: no tenderness, no masses palpated. No hepatosplenomegaly. Bowel sounds positive.  Musculoskeletal: no clubbing / cyanosis. No joint deformity upper and lower extremities. Good ROM, no contractures. Normal muscle tone.  Skin: no rashes, lesions, ulcers. No induration Neurologic: CN 2-12 grossly intact. Sensation intact, DTR normal. Strength 5/5 in all 4.  Psychiatric: Normal judgment and insight. Alert and oriented x 3. Normal mood.    Labs on Admission: I have personally reviewed following labs and  imaging studies  CBC: Recent Labs  Lab 02/07/18 1016  WBC 14.2*  NEUTROABS 12.1*  HGB 10.9*  HCT 33.1*  MCV 90.4  PLT 231   Basic Metabolic Panel: Recent Labs  Lab 02/07/18 1016  NA 136  K 4.1  CL 101  CO2 25  GLUCOSE 103*  BUN 12  CREATININE 0.80  CALCIUM 8.6*   GFR: Estimated Creatinine Clearance: 85 mL/min (by C-G formula based on SCr of 0.8 mg/dL). Liver Function Tests: No results for input(s): AST, ALT, ALKPHOS, BILITOT, PROT, ALBUMIN in the last 168 hours. No results for input(s): LIPASE, AMYLASE in the last 168 hours. No results for  input(s): AMMONIA in the last 168 hours. Coagulation Profile: No results for input(s): INR, PROTIME in the last 168 hours. Cardiac Enzymes: No results for input(s): CKTOTAL, CKMB, CKMBINDEX, TROPONINI in the last 168 hours. BNP (last 3 results) No results for input(s): PROBNP in the last 8760 hours. HbA1C: No results for input(s): HGBA1C in the last 72 hours. CBG: No results for input(s): GLUCAP in the last 168 hours. Lipid Profile: No results for input(s): CHOL, HDL, LDLCALC, TRIG, CHOLHDL, LDLDIRECT in the last 72 hours. Thyroid Function Tests: No results for input(s): TSH, T4TOTAL, FREET4, T3FREE, THYROIDAB in the last 72 hours. Anemia Panel: No results for input(s): VITAMINB12, FOLATE, FERRITIN, TIBC, IRON, RETICCTPCT in the last 72 hours. Urine analysis: No results found for: COLORURINE, APPEARANCEUR, LABSPEC, PHURINE, GLUCOSEU, HGBUR, BILIRUBINUR, KETONESUR, PROTEINUR, UROBILINOGEN, NITRITE, LEUKOCYTESUR  Radiological Exams on Admission: Dg Chest 2 View  Result Date: 02/07/2018 CLINICAL DATA:  Cough. EXAM: CHEST  2 VIEW COMPARISON:  None. FINDINGS: Peripheral airspace opacity in the right lower lobe noted. The lungs appear otherwise clear. Cardiac and mediastinal margins appear normal. No bony abnormality observed. IMPRESSION: 1. Right lower lobe airspace opacity is presumably pneumonia given the patient's history. Followup PA and lateral chest X-ray is recommended in 3-4 weeks following trial of antibiotic therapy to ensure resolution and exclude underlying malignancy. Electronically Signed   By: Gaylyn RongWalter  Liebkemann M.D.   On: 02/07/2018 10:57    EKG: Independently reviewed.  Assessment/Plan Principal Problem:   Community acquired pneumonia of right lower lobe of lung (HCC) Active Problems:   Acute respiratory failure with hypoxia (HCC)    1. RLL CAP - 1. PNA pathway 2. Stop vanc - not really indicated for CAP, patient doesn't really have any risk factors to suggest MRSA  PNA nor is she ICU / SDU status at this time. 3. Continue rocephin 4. Add doxycycline 5. Cultures pending 6. IVF: 2L bolus in ED, NS at 75 7. Repeat CBC / BMP in AM 8. Steroids: got decadron 10 in ED, will put patient on prednisone 40 daily. 2. Acute respiratory failure with hypoxia - 1. New O2 requirement, desatted when ambulating.  DVT prophylaxis: Lovenox Code Status: Full Family Communication: No family in room Disposition Plan: Home after admit Consults called: None Admission status: Admit to inpatient - IP status for new O2 requirement   Hillary BowGARDNER, JARED M. DO [facility] Pager [phone]  If 7AM-7PM, please contact day team taking care of patient [email or web address] Password [email or web address]  02/07/2018, 8:46 PM

## 2018-02-07 NOTE — ED Notes (Signed)
Pt ambulated with pulse ox   Heart rate increased from 106 to 123  O2 sats dropped from 93 to 88

## 2018-02-07 NOTE — ED Triage Notes (Signed)
Flu x 2weeks ago   Treated   Felt better then worse again  Seen in office x 3 days ago

## 2018-02-08 DIAGNOSIS — J181 Lobar pneumonia, unspecified organism: Principal | ICD-10-CM

## 2018-02-08 LAB — BASIC METABOLIC PANEL
ANION GAP: 9 (ref 5–15)
BUN: 16 mg/dL (ref 6–20)
CHLORIDE: 110 mmol/L (ref 101–111)
CO2: 21 mmol/L — AB (ref 22–32)
Calcium: 8.4 mg/dL — ABNORMAL LOW (ref 8.9–10.3)
Creatinine, Ser: 0.61 mg/dL (ref 0.44–1.00)
GFR calc Af Amer: >60 mL/min (ref >60)
GLUCOSE: 134 mg/dL — AB (ref 65–99)
POTASSIUM: 4 mmol/L (ref 3.5–5.1)
Sodium: 140 mmol/L (ref 135–145)

## 2018-02-08 LAB — CBC
HEMATOCRIT: 30.2 % — AB (ref 36.0–46.0)
HEMOGLOBIN: 9.8 g/dL — AB (ref 12.0–15.0)
MCH: 29.6 pg (ref 26.0–34.0)
MCHC: 32.5 g/dL (ref 30.0–36.0)
MCV: 91.2 fL (ref 78.0–100.0)
Platelets: 236 10*3/uL (ref 150–400)
RBC: 3.31 MIL/uL — AB (ref 3.87–5.11)
RDW: 12.1 % (ref 11.5–15.5)
WBC: 12.5 10*3/uL — AB (ref 4.0–10.5)

## 2018-02-08 LAB — HIV ANTIBODY (ROUTINE TESTING W REFLEX): HIV Screen 4th Generation wRfx: NONREACTIVE

## 2018-02-08 LAB — STREP PNEUMONIAE URINARY ANTIGEN: Strep Pneumo Urinary Antigen: NEGATIVE

## 2018-02-08 NOTE — Progress Notes (Signed)
[facility]  PROGRESS NOTE  Sue Yoder MVH:[id number] DOB: [birth year] DOA: 02/07/2018 PCP: Clayborn Heronankins, Victoria R, MD   Brief HPI:   42 y.o. female with medical history significant of Influenza last week.  Finished tamiflu.  Symptoms had been getting better but then over course of last couple of days developed recurrent fever, cough, nausea, fatigue.  Headache (does have h/o migraines).  Progressively worsening SOB.  Put on azithromycin a couple of days ago by PCP but that didn't help much.  No neck stiffness.       Subjective   Patient seen and examined, breathing better. Denies coughing up phlegm.    Assessment/Plan:     1. Right lower lobe pneumonia- slowly improving, patient still hypoxic on ambulation with O2 sats dropping 87% on room air on ambulation, patient started on Rocephin, doxycycline. Blood cultures are pending. Continue prednisone 40 mg PO daily.     DVT prophylaxis: Lovenox  Code Status: Full code  Family Communication: No family at bedside  Disposition Plan: Home in 1-2 days   Consultants:  None   Procedures:  None   Continuous infusions . sodium chloride 75 mL/hr at 02/07/18 2254  . cefTRIAXone (ROCEPHIN)  IV        Antibiotics:   Anti-infectives (From admission, onward)   Start     Dose/Rate Route Frequency Ordered Stop   02/08/18 1200  cefTRIAXone (ROCEPHIN) 1 g in sodium chloride 0.9 % 100 mL IVPB     1 g 200 mL/hr over 30 Minutes Intravenous Every 24 hours 02/07/18 2041 02/15/18 1159   02/07/18 2200  doxycycline (VIBRA-TABS) tablet 100 mg     100 mg Oral Every 12 hours 02/07/18 2041 02/14/18 2159   02/07/18 1300  vancomycin (VANCOCIN) IVPB 1000 mg/200 mL premix     1,000 mg 200 mL/hr over 60 Minutes Intravenous  Once 02/07/18 1250 02/07/18 1600   02/07/18 1115  cefTRIAXone (ROCEPHIN) 2 g in sodium chloride 0.9 % 100 mL IVPB     2 g 200 mL/hr over 30 Minutes Intravenous  Once 02/07/18 1113 02/07/18 1230       Objective    Vitals:   02/07/18 1654 02/07/18 1908 02/07/18 2106 02/08/18 0528  BP: 92/67 98/65 98/73  90/67  Pulse: 89 89 73 83  Resp: 16 18 18 18   Temp: 98.3 F (36.8 C) 98 F (36.7 C) (!) 97.5 F (36.4 C) 98 F (36.7 C)  TempSrc: Oral Oral Oral Oral  SpO2: 99% 96% 95% 95%  Weight:      Height:        Intake/Output Summary (Last 24 hours) at 02/08/2018 1206 Last data filed at 02/08/2018 1119 Gross per 24 hour  Intake -  Output 250 ml  Net -250 ml   Filed Weights   02/07/18 [id number]  Weight: 59.4 kg (131 lb)     Physical Examination:   Physical Exam: Eyes: No icterus, extraocular muscles intact  Mouth: Oral mucosa is moist, no lesions on palate,  Neck: Supple, no deformities, masses, or tenderness Lungs: Normal respiratory effort, bilateral  rhonchi Heart: Regular rate and rhythm, S1 and S2 normal, no murmurs, rubs auscultated Abdomen: BS normoactive,soft,nondistended,non-tender to palpation,no organomegaly Extremities: No pretibial edema, no erythema, no cyanosis, no clubbing Neuro : Alert and oriented to time, place and person, No focal deficits  Skin: No rashes seen on exam     Data Reviewed: I have personally reviewed following labs and imaging studies  CBG: No results for input(s): GLUCAP in  the last 168 hours.  CBC: Recent Labs  Lab 02/07/18 1016 02/08/18 0558  WBC 14.2* 12.5*  NEUTROABS 12.1*  --   HGB 10.9* 9.8*  HCT 33.1* 30.2*  MCV 90.4 91.2  PLT 231 236    Basic Metabolic Panel: Recent Labs  Lab 02/07/18 1016 02/08/18 0558  NA 136 140  K 4.1 4.0  CL 101 110  CO2 25 21*  GLUCOSE 103* 134*  BUN 12 16  CREATININE 0.80 0.61  CALCIUM 8.6* 8.4*    No results found for this or any previous visit (from the past 240 hour(s)).   Liver Function Tests: No results for input(s): AST, ALT, ALKPHOS, BILITOT, PROT, ALBUMIN in the last 168 hours. No results for input(s): LIPASE, AMYLASE in the last 168 hours. No results for input(s): AMMONIA in the last  168 hours.  Cardiac Enzymes: No results for input(s): CKTOTAL, CKMB, CKMBINDEX, TROPONINI in the last 168 hours. BNP (last 3 results) No results for input(s): BNP in the last 8760 hours.  ProBNP (last 3 results) No results for input(s): PROBNP in the last 8760 hours.    Studies: Dg Chest 2 View  Result Date: 02/07/2018 CLINICAL DATA:  Cough. EXAM: CHEST  2 VIEW COMPARISON:  None. FINDINGS: Peripheral airspace opacity in the right lower lobe noted. The lungs appear otherwise clear. Cardiac and mediastinal margins appear normal. No bony abnormality observed. IMPRESSION: 1. Right lower lobe airspace opacity is presumably pneumonia given the patient's history. Followup PA and lateral chest X-ray is recommended in 3-4 weeks following trial of antibiotic therapy to ensure resolution and exclude underlying malignancy. Electronically Signed   By: Gaylyn Rong M.D.   On: 02/07/2018 10:57    Scheduled Meds: . doxycycline  100 mg Oral Q12H  . enoxaparin (LOVENOX) injection  40 mg Subcutaneous Q24H  . predniSONE  40 mg Oral Q breakfast      Time spent: 25 min  [name]   [facility] Pager [phone]. If 7PM-7AM, please contact night-coverage at [email or web address], Office  [phone]  password [email or web address]  02/08/2018, 12:06 PM  LOS: 1 day

## 2018-02-09 LAB — CBC
HCT: 27.2 % — ABNORMAL LOW (ref 36.0–46.0)
Hemoglobin: 9 g/dL — ABNORMAL LOW (ref 12.0–15.0)
MCH: 30.3 pg (ref 26.0–34.0)
MCHC: 33.1 g/dL (ref 30.0–36.0)
MCV: 91.6 fL (ref 78.0–100.0)
PLATELETS: 264 10*3/uL (ref 150–400)
RBC: 2.97 MIL/uL — AB (ref 3.87–5.11)
RDW: 12.1 % (ref 11.5–15.5)
WBC: 13.9 10*3/uL — AB (ref 4.0–10.5)

## 2018-02-09 LAB — BASIC METABOLIC PANEL
ANION GAP: 8 (ref 5–15)
BUN: 16 mg/dL (ref 6–20)
CO2: 22 mmol/L (ref 22–32)
Calcium: 8.2 mg/dL — ABNORMAL LOW (ref 8.9–10.3)
Chloride: 112 mmol/L — ABNORMAL HIGH (ref 101–111)
Creatinine, Ser: 0.63 mg/dL (ref 0.44–1.00)
Glucose, Bld: 105 mg/dL — ABNORMAL HIGH (ref 65–99)
POTASSIUM: 3.6 mmol/L (ref 3.5–5.1)
Sodium: 142 mmol/L (ref 135–145)

## 2018-02-09 MED ORDER — BENZONATATE 100 MG PO CAPS: 100.0000 mg | ORAL_CAPSULE | Freq: Three times a day (TID) | ORAL | 0 refills | Status: DC | PRN

## 2018-02-09 MED ORDER — DOXYCYCLINE HYCLATE 100 MG PO TABS: 100.0000 mg | ORAL_TABLET | Freq: Two times a day (BID) | ORAL | 0 refills | Status: AC

## 2018-02-09 MED ORDER — PREDNISONE 10 MG PO TABS: ORAL_TABLET | ORAL | 0 refills | Status: DC

## 2018-02-09 MED ORDER — CEFUROXIME AXETIL 500 MG PO TABS: 500.0000 mg | ORAL_TABLET | Freq: Two times a day (BID) | ORAL | 0 refills | Status: AC

## 2018-02-09 MED ORDER — GUAIFENESIN-DM 100-10 MG/5ML PO SYRP
5.0000 mL | ORAL_SOLUTION | ORAL | Status: DC | PRN
  Administered 2018-02-09: 5 mL via ORAL
  Filled 2018-02-09: qty 10

## 2018-02-09 NOTE — Plan of Care (Signed)
  Nutrition: Adequate nutrition will be maintained 02/09/2018 1024 - Adequate for Discharge by William Daltonarpenter, Faizan Geraci L, RN   Activity: Risk for activity intolerance will decrease 02/09/2018 1024 - Adequate for Discharge by William Daltonarpenter, Ashle Stief L, RN   Elimination: Will not experience complications related to bowel motility 02/09/2018 1024 - Adequate for Discharge by William Daltonarpenter, Winston Misner L, RN   Pain Managment: General experience of comfort will improve 02/09/2018 1024 - Adequate for Discharge by William Daltonarpenter, Tashara Suder L, RN

## 2018-02-09 NOTE — Discharge Summary (Signed)
Physician Discharge Summary  Sue Yoder ZOX:[id number] DOB: July 23, 1976 DOA: 02/07/2018  PCP: Clayborn Heron, MD  Admit date: 02/07/2018 Discharge date: 02/09/2018  Time spent: 35 minutes  Recommendations for Outpatient Follow-up:  1. Follow up PCP in 2 weeks   Discharge Diagnoses:  Principal Problem:   Community acquired pneumonia of right lower lobe of lung (HCC) Active Problems:   Acute respiratory failure with hypoxia [facility])   Discharge Condition: Stable  Diet recommendation: regular diet  Filed Weights   02/07/18 0927  Weight: 59.4 kg (131 lb)    History of present illness:  42 y.o.femalewith medical history significant ofInfluenza last week. Finished tamiflu. Symptoms had been getting better but then over course of last couple of days developed recurrent fever, cough, nausea, fatigue. Headache (does have h/o migraines). Progressively worsening SOB. Put on azithromycin a couple of days ago by PCP but that didn't help much. No neck stiffness.     Hospital Course:   1. Right lower lobe pneumonia-  improving, patient is no longer requiring oxygen, she was started on  Rocephin, doxycycline. Will discharge home on Po Ceftin, Doxycycline for seven more days. Tessalon perles prn for cough.     Procedures:  None   Consultations:  None   Discharge Exam: Vitals:   02/09/18 0436 02/09/18 0856  BP: 99/71   Pulse: 61   Resp: 16   Temp: 98.1 F (36.7 C)   SpO2: 95% 95%    General: Appears in no acute distress Cardiovascular: S1S2 RRR Respiratory: Clear bilaterally  Discharge Instructions    Allergies as of 02/09/2018      Reactions   Ciprofloxacin    Erythromycin    Septra [sulfamethoxazole-trimethoprim]       Medication List    STOP taking these medications   azithromycin 250 MG tablet Commonly known as:  ZITHROMAX     TAKE these medications   acetaminophen 325 MG tablet Commonly known as:  TYLENOL Take 650 mg by mouth every  6 (six) hours as needed. Head ache   benzonatate 100 MG capsule Commonly known as:  TESSALON PERLES Take 1 capsule (100 mg total) by mouth 3 (three) times daily as needed for cough.   cefUROXime 500 MG tablet Commonly known as:  CEFTIN Take 1 tablet (500 mg total) by mouth 2 (two) times daily for 6 days.   doxycycline 100 MG tablet Commonly known as:  VIBRA-TABS Take 1 tablet (100 mg total) by mouth every 12 (twelve) hours for 6 days.   MYRBETRIQ PO Take by mouth.   ondansetron 4 MG tablet Commonly known as:  ZOFRAN Take 4 mg by mouth every 8 (eight) hours as needed for nausea or vomiting.   predniSONE 10 MG tablet Commonly known as:  DELTASONE Prednisone 40 mg po daily x 1 day then Prednisone 30 mg po daily x 1 day then Prednisone 20 mg po daily x 1 day then Prednisone 10 mg daily x 1 day then stop...   tolterodine 4 MG 24 hr capsule Commonly known as:  DETROL LA Take 4 mg by mouth daily.      Allergies  Allergen Reactions  . Ciprofloxacin   . Erythromycin   . Septra [Sulfamethoxazole-Trimethoprim]       The results of significant diagnostics from this hospitalization (including imaging, microbiology, ancillary and laboratory) are listed below for reference.    Significant Diagnostic Studies: Dg Chest 2 View  Result Date: 02/07/2018 CLINICAL DATA:  Cough. EXAM: CHEST  2 VIEW COMPARISON:  None. FINDINGS: Peripheral airspace opacity in the right lower lobe noted. The lungs appear otherwise clear. Cardiac and mediastinal margins appear normal. No bony abnormality observed. IMPRESSION: 1. Right lower lobe airspace opacity is presumably pneumonia given the patient's history. Followup PA and lateral chest X-ray is recommended in 3-4 weeks following trial of antibiotic therapy to ensure resolution and exclude underlying malignancy. Electronically Signed   By: Gaylyn RongWalter  Liebkemann M.D.   On: 02/07/2018 10:57    Microbiology: Recent Results (from the past 240 hour(s))   Blood culture (routine x 2)     Status: None (Preliminary result)   Collection Time: 02/07/18  2:20 PM  Result Value Ref Range Status   Specimen Description   Final    BLOOD RIGHT ARM Performed at [facility] [place], [address] [place], [place] [address]    Special Requests   Final    BOTTLES DRAWN AEROBIC AND ANAEROBIC Blood Culture adequate volume Performed at [facility] [place], [address] [place], [place] [address]    Culture   Final    NO GROWTH < 24 HOURS Performed at [facility], [address] [place], [place] [address]    Report Status PENDING  Incomplete  Blood culture (routine x 2)     Status: None (Preliminary result)   Collection Time: 02/07/18  2:45 PM  Result Value Ref Range Status   Specimen Description   Final    BLOOD RIGHT FOREARM Performed at [facility] [place], [address] [place], [place] [address]    Special Requests   Final    IN PEDIATRIC BOTTLE Blood Culture adequate volume Performed at [facility] [place], [address] [place], [place] [address]    Culture   Final    NO GROWTH < 24 HOURS Performed at [facility], [address] [place], [place] [address]    Report Status PENDING  Incomplete     Labs: Basic Metabolic Panel: Recent Labs  Lab 02/07/18 1016 02/08/18 0558 02/09/18 0554  NA 136 140 142  K 4.1 4.0 3.6  CL 101 110 112*  CO2 25 21* 22  GLUCOSE 103* 134* 105*  BUN 12 16 16   CREATININE 0.80 0.61 0.63  CALCIUM 8.6* 8.4* 8.2*   Liver Function Tests: No results for input(s): AST, ALT, ALKPHOS, BILITOT, PROT, ALBUMIN in the last 168 hours. No results for input(s): LIPASE, AMYLASE in the last 168 hours. No results for input(s): AMMONIA in the last 168 hours. CBC: Recent Labs  Lab 02/07/18 1016 02/08/18 0558 02/09/18 0554  WBC 14.2* 12.5* 13.9*  NEUTROABS 12.1*  --   --   HGB 10.9* 9.8* 9.0*  HCT 33.1* 30.2* 27.2*  MCV 90.4 91.2 91.6  PLT 231 236 264       Signed:  Meredeth IdeGagan S Marlyss Cissell MD.  [facility] 02/09/2018, 9:58 AM

## 2018-02-09 NOTE — Progress Notes (Signed)
02/09/18  1030  Reviewed discharge instructions with patient. Patient verbalized understanding of discharge instructions. Copy of discharge instructions and prescriptions given to patient.

## 2018-02-12 LAB — CULTURE, BLOOD (ROUTINE X 2)
CULTURE: NO GROWTH
Culture: NO GROWTH
SPECIAL REQUESTS: ADEQUATE
Special Requests: ADEQUATE

## 2018-07-02 ENCOUNTER — Ambulatory Visit: Payer: BLUE CROSS/BLUE SHIELD | Admitting: Allergy and Immunology

## 2018-07-02 ENCOUNTER — Encounter: Payer: Self-pay | Admitting: Allergy and Immunology

## 2018-07-02 VITALS — BP 102/64 | HR 68 | Temp 98.1°F | Resp 18 | Ht 66.0 in | Wt 139.4 lb

## 2018-07-02 DIAGNOSIS — L500 Allergic urticaria: Secondary | ICD-10-CM

## 2018-07-02 DIAGNOSIS — K2960 Other gastritis without bleeding: Secondary | ICD-10-CM

## 2018-07-02 DIAGNOSIS — K2970 Gastritis, unspecified, without bleeding: Secondary | ICD-10-CM | POA: Diagnosis not present

## 2018-07-02 MED ORDER — LEVOCETIRIZINE DIHYDROCHLORIDE 5 MG PO TABS: 5.0000 mg | ORAL_TABLET | Freq: Every evening | ORAL | 5 refills | Status: AC

## 2018-07-02 NOTE — Progress Notes (Signed)
    Follow-up Note  RE: Sue Yoder MRN: [id number] DOB: [birth year] Date of Office Visit: 07/02/2018  Primary care provider: Aretta Nip, MD Referring provider: Aretta Nip, MD  History of present illness: Sue Yoder is a 42 y.o. female with a history of urticaria presenting today for follow-up.  She is previously seen in this clinic for her initial evaluation in September 2018.  She is noted experienced recurrence of urticaria in the interval since her previous visit until this past Thursday.  She developed hives on her abdomen and groin area, which is a similar distribution as the initial episode last year.  The hives were described as red, raised, and itchy.  The hives came and went over the span of 3 days and began to resolve yesterday.  The hives were nonpainful and not particularly pruritic.  She did not experience concomitant angioedema, cardiopulmonary symptoms, or GI symptoms.  She rarely experiences heartburn.  Assessment and plan: Recurrent urticaria Unclear etiology.  The following labs have been ordered: FCeRI antibody, anti-thyroglobulin antibody, thyroid peroxidase antibody, tryptase, urea breath test, CBC, CMP, ESR, ANA, and galactose-alpha-1,3-galactose IgE level.  The patient will be called with further recommendations after lab results have returned.  Instructions have been discussed and provided for H1/H2 receptor blockade with titration to find lowest effective dose.   Meds ordered this encounter  Medications  . levocetirizine (XYZAL) 5 MG tablet    Sig: Take 1 tablet (5 mg total) by mouth every evening.    Dispense:  30 tablet    Refill:  5    Diagnostics: Labs have been drawn.    Physical examination: Blood pressure 102/64, pulse 68, temperature 98.1 F (36.7 C), temperature source Oral, resp. rate 18, height '5\' 6"'$  (1.676 m), weight 139 lb 6.4 oz (63.2 kg), SpO2 98 %, unknown if currently breastfeeding.  General: Alert,  interactive, in no acute distress. Neck: Supple without lymphadenopathy. Lungs: Clear to auscultation without wheezing, rhonchi or rales. CV: Normal S1, S2 without murmurs. Skin: Warm and dry, without lesions or rashes.  The following portions of the patient's history were reviewed and updated as appropriate: allergies, current medications, past family history, past medical history, past social history, past surgical history and problem list.  Allergies as of 07/02/2018      Reactions   Ciprofloxacin    Erythromycin    Septra [sulfamethoxazole-trimethoprim]       Medication List        Accurate as of 07/02/18 12:22 PM. Always use your most recent med list.          acetaminophen 325 MG tablet Commonly known as:  TYLENOL Take 650 mg by mouth every 6 (six) hours as needed. Head ache   levocetirizine 5 MG tablet Commonly known as:  XYZAL Take 1 tablet (5 mg total) by mouth every evening.       Allergies  Allergen Reactions  . Ciprofloxacin   . Erythromycin   . Septra [Sulfamethoxazole-Trimethoprim]     I appreciate the opportunity to take part in [facility] care. Please do not hesitate to contact me with questions.  Sincerely,   R. Edgar Frisk, MD

## 2018-07-02 NOTE — Assessment & Plan Note (Addendum)
Unclear etiology.  The following labs have been ordered: FCeRI antibody, anti-thyroglobulin antibody, thyroid peroxidase antibody, tryptase, urea breath test, CBC, CMP, ESR, ANA, and galactose-alpha-1,3-galactose IgE level.  The patient will be called with further recommendations after lab results have returned.  Instructions have been discussed and provided for H1/H2 receptor blockade with titration to find lowest effective dose.

## 2018-07-02 NOTE — Patient Instructions (Addendum)
Recurrent urticaria Unclear etiology.  The following labs have been ordered: FCeRI antibody, anti-thyroglobulin antibody, thyroid peroxidase antibody, tryptase, urea breath test, CBC, CMP, ESR, ANA, and galactose-alpha-1,3-galactose IgE level.  The patient will be called with further recommendations after lab results have returned.  Instructions have been discussed and provided for H1/H2 receptor blockade with titration to find lowest effective dose.   When lab results have returned the patient will be called with further recommendations and follow up instructions.  Urticaria (Hives)  . Levocetirizine (Xyzal) 5 mg twice a day and ranitidine (Zantac) 150 mg twice a day. If no symptoms for 7-14 days then decrease to. . Levocetirizine (Xyzal) 5 mg twice a day and ranitidine (Zantac) 150 mg once a day.  If no symptoms for 7-14 days then decrease to. . Levocetirizine (Xyzal) 5 mg twice a day.  If no symptoms for 7-14 days then decrease to. . Levocetirizine (Xyzal) 5 mg once a day.  May use Benadryl (diphenhydramine) as needed for breakthrough symptoms       If symptoms return, then step up dosage

## 2018-07-04 LAB — H. PYLORI BREATH TEST: H pylori Breath Test: NEGATIVE

## 2018-07-06 LAB — CBC WITH DIFFERENTIAL/PLATELET
Basophils Absolute: 0 10*3/uL (ref 0.0–0.2)
Basos: 0 %
EOS (ABSOLUTE): 0 10*3/uL (ref 0.0–0.4)
Eos: 1 %
Hematocrit: 36.8 % (ref 34.0–46.6)
Hemoglobin: 11.6 g/dL (ref 11.1–15.9)
Immature Grans (Abs): 0 10*3/uL (ref 0.0–0.1)
Immature Granulocytes: 0 %
Lymphocytes Absolute: 1.4 10*3/uL (ref 0.7–3.1)
Lymphs: 23 %
MCH: 28.9 pg (ref 26.6–33.0)
MCHC: 31.5 g/dL (ref 31.5–35.7)
MCV: 92 fL (ref 79–97)
Monocytes Absolute: 0.3 10*3/uL (ref 0.1–0.9)
Monocytes: 5 %
Neutrophils Absolute: 4.3 10*3/uL (ref 1.4–7.0)
Neutrophils: 71 %
Platelets: 168 10*3/uL (ref 150–450)
RBC: 4.01 x10E6/uL (ref 3.77–5.28)
RDW: 12.9 % (ref 12.3–15.4)
WBC: 6 10*3/uL (ref 3.4–10.8)

## 2018-07-06 LAB — COMPREHENSIVE METABOLIC PANEL
ALT: 7 IU/L (ref 0–32)
AST: 16 IU/L (ref 0–40)
Albumin/Globulin Ratio: 2 (ref 1.2–2.2)
Albumin: 4.4 g/dL (ref 3.5–5.5)
Alkaline Phosphatase: 41 IU/L (ref 39–117)
BUN/Creatinine Ratio: 19 (ref 9–23)
BUN: 16 mg/dL (ref 6–24)
Bilirubin Total: 0.6 mg/dL (ref 0.0–1.2)
CO2: 23 mmol/L (ref 20–29)
Calcium: 9.1 mg/dL (ref 8.7–10.2)
Chloride: 103 mmol/L (ref 96–106)
Creatinine, Ser: 0.85 mg/dL (ref 0.57–1.00)
GFR calc Af Amer: 96 mL/min/{1.73_m2} (ref >59)
GFR calc non Af Amer: 84 mL/min/{1.73_m2} (ref >59)
Globulin, Total: 2.2 g/dL (ref 1.5–4.5)
Glucose: 86 mg/dL (ref 65–99)
Potassium: 4.1 mmol/L (ref 3.5–5.2)
Sodium: 139 mmol/L (ref 134–144)
Total Protein: 6.6 g/dL (ref 6.0–8.5)

## 2018-07-06 LAB — ALPHA-GAL PANEL
Alpha Gal IgE*: <0.1 kU/L (ref <0.10)
Beef (Bos spp) IgE: <0.1 kU/L (ref <0.35)
Class Interpretation: 0
Class Interpretation: 0
Class Interpretation: 0
Lamb/Mutton (Ovis spp) IgE: <0.1 kU/L (ref <0.35)
Pork (Sus spp) IgE: <0.1 kU/L (ref <0.35)

## 2018-07-06 LAB — ANA W/REFLEX: Anti Nuclear Antibody(ANA): NEGATIVE

## 2018-07-06 LAB — CHRONIC URTICARIA: cu index: 12.4 — ABNORMAL HIGH (ref <10)

## 2018-07-06 LAB — TRYPTASE: Tryptase: 3.1 ug/L (ref 2.2–13.2)

## 2018-07-06 LAB — THYROGLOBULIN ANTIBODY: Thyroglobulin Antibody: <1 IU/mL (ref 0.0–0.9)

## 2018-07-06 LAB — THYROID PEROXIDASE ANTIBODY: Thyroperoxidase Ab SerPl-aCnc: 10 IU/mL (ref 0–34)

## 2018-07-06 LAB — SEDIMENTATION RATE: Sed Rate: 6 mm/hr (ref 0–32)

## 2018-07-10 DIAGNOSIS — M62838 Other muscle spasm: Secondary | ICD-10-CM | POA: Diagnosis not present

## 2018-07-10 DIAGNOSIS — K5900 Constipation, unspecified: Secondary | ICD-10-CM | POA: Diagnosis not present

## 2018-07-10 DIAGNOSIS — M6289 Other specified disorders of muscle: Secondary | ICD-10-CM | POA: Diagnosis not present

## 2018-10-03 DIAGNOSIS — Z1231 Encounter for screening mammogram for malignant neoplasm of breast: Secondary | ICD-10-CM | POA: Diagnosis not present

## 2018-10-03 DIAGNOSIS — Z01419 Encounter for gynecological examination (general) (routine) without abnormal findings: Secondary | ICD-10-CM | POA: Diagnosis not present

## 2018-10-03 DIAGNOSIS — Z6822 Body mass index (BMI) 22.0-22.9, adult: Secondary | ICD-10-CM | POA: Diagnosis not present

## 2018-10-04 ENCOUNTER — Other Ambulatory Visit: Payer: Self-pay | Admitting: Obstetrics and Gynecology

## 2018-10-04 DIAGNOSIS — R928 Other abnormal and inconclusive findings on diagnostic imaging of breast: Secondary | ICD-10-CM

## 2018-10-09 ENCOUNTER — Ambulatory Visit
Admission: RE | Admit: 2018-10-09 | Discharge: 2018-10-09 | Disposition: A | Discharge status: Home / Self Care | Payer: 59 | Source: Ambulatory Visit | Attending: Obstetrics and Gynecology | Admitting: Obstetrics and Gynecology

## 2018-10-09 ENCOUNTER — Ambulatory Visit
Admission: RE | Admit: 2018-10-09 | Discharge: 2018-10-09 | Disposition: A | Discharge status: Home / Self Care | Payer: BLUE CROSS/BLUE SHIELD | Source: Ambulatory Visit | Attending: Obstetrics and Gynecology | Admitting: Obstetrics and Gynecology

## 2018-10-09 DIAGNOSIS — R928 Other abnormal and inconclusive findings on diagnostic imaging of breast: Secondary | ICD-10-CM

## 2018-10-09 DIAGNOSIS — R922 Inconclusive mammogram: Secondary | ICD-10-CM | POA: Diagnosis not present

## 2018-10-09 DIAGNOSIS — N6011 Diffuse cystic mastopathy of right breast: Secondary | ICD-10-CM | POA: Diagnosis not present

## 2018-10-10 DIAGNOSIS — D229 Melanocytic nevi, unspecified: Secondary | ICD-10-CM | POA: Diagnosis not present

## 2018-10-10 DIAGNOSIS — L718 Other rosacea: Secondary | ICD-10-CM | POA: Diagnosis not present

## 2018-10-10 DIAGNOSIS — L820 Inflamed seborrheic keratosis: Secondary | ICD-10-CM | POA: Diagnosis not present

## 2018-10-10 DIAGNOSIS — L814 Other melanin hyperpigmentation: Secondary | ICD-10-CM | POA: Diagnosis not present

## 2018-10-10 DIAGNOSIS — L821 Other seborrheic keratosis: Secondary | ICD-10-CM | POA: Diagnosis not present

## 2018-12-10 DIAGNOSIS — J019 Acute sinusitis, unspecified: Secondary | ICD-10-CM | POA: Diagnosis not present

## 2019-01-03 ENCOUNTER — Other Ambulatory Visit: Payer: Self-pay | Admitting: Allergy and Immunology

## 2019-01-07 ENCOUNTER — Ambulatory Visit: Payer: BLUE CROSS/BLUE SHIELD | Admitting: Allergy and Immunology

## 2019-01-07 ENCOUNTER — Encounter: Payer: Self-pay | Admitting: Allergy and Immunology

## 2019-01-07 DIAGNOSIS — L500 Allergic urticaria: Secondary | ICD-10-CM

## 2019-01-07 DIAGNOSIS — J310 Chronic rhinitis: Secondary | ICD-10-CM | POA: Diagnosis not present

## 2019-01-07 MED ORDER — LEVOCETIRIZINE DIHYDROCHLORIDE 5 MG PO TABS: 5.0000 mg | ORAL_TABLET | Freq: Every evening | ORAL | 5 refills | Status: DC

## 2019-01-07 MED ORDER — FLUTICASONE PROPIONATE 50 MCG/ACT NA SUSP: 2.0000 | Freq: Every day | NASAL | 5 refills | Status: AC

## 2019-01-07 NOTE — Assessment & Plan Note (Signed)
   A prescription has been provided for fluticasone nasal spray (Flonase), one spray per nostril 1-2 times daily as needed. Proper nasal spray technique has been discussed and demonstrated.  Nasal saline spray (i.e. Simply Saline) is recommended prior to medicated nasal sprays and as needed.

## 2019-01-07 NOTE — Progress Notes (Signed)
Follow-up Note  RE: Sue Yoder MRN: [id number] DOB: 21-Oct-1976 Date of Office Visit: 01/07/2019  Primary care provider: Clayborn Heron, MD Referring provider: Clayborn Heron, MD  History of present illness: Sue Yoder is a 43 y.o. female with a history of urticaria presenting today for follow-up.  She was last seen in this clinic in July 2019.  She reports that in the interval since her previous visit her urticaria has been well controlled with levocetirizine 5 mg daily.  However, if she discontinues this medication for a few days she begins to experience generalized pruritus, which is occasionally followed by urticaria.  She does not experience concomitant angioedema, cardiopulmonary symptoms, or GI symptoms.  She has also noted that when she is not taking allergy medications she experiences more frequent sneezing and rhinorrhea.  Assessment and plan: Recurrent urticaria  Continue levocetirizine 5 mg daily as needed.  If needed, may step up to levocetirizine twice daily and adding famotidine (Pepcid) if needed.  To avoid diminishing benefit with daily use (tachyphylaxis) of second generation antihistamine, consider alternating every few months between fexofenadine (Allegra) and levocetirizine (Xyzal).  Chronic rhinitis  A prescription has been provided for fluticasone nasal spray (Flonase), one spray per nostril 1-2 times daily as needed. Proper nasal spray technique has been discussed and demonstrated.  Nasal saline spray (i.e. Simply Saline) is recommended prior to medicated nasal sprays and as needed.   Meds ordered this encounter  Medications  . fluticasone (FLONASE) 50 MCG/ACT nasal spray    Sig: Place 2 sprays into both nostrils daily.    Dispense:  1 g    Refill:  5  . levocetirizine (XYZAL) 5 MG tablet    Sig: Take 1 tablet (5 mg total) by mouth every evening.    Dispense:  30 tablet    Refill:  5    Physical examination: Blood pressure  98/60, pulse 72, resp. rate 16.  General: Alert, interactive, in no acute distress. HEENT: TMs pearly gray, turbinates mildly edematous without discharge, post-pharynx unremarkable. Neck: Supple without lymphadenopathy. Lungs: Clear to auscultation without wheezing, rhonchi or rales. CV: Normal S1, S2 without murmurs. Skin: Warm and dry, without lesions or rashes.  The following portions of the patient's history were reviewed and updated as appropriate: allergies, current medications, past family history, past medical history, past social history, past surgical history and problem list.  Allergies as of 01/07/2019      Reactions   Ciprofloxacin    Erythromycin    Septra [sulfamethoxazole-trimethoprim]       Medication List       Accurate as of January 07, 2019 12:59 PM. Always use your most recent med list.        acetaminophen 325 MG tablet Commonly known as:  TYLENOL Take 650 mg by mouth every 6 (six) hours as needed. Head ache   fluticasone 50 MCG/ACT nasal spray Commonly known as:  FLONASE Place 2 sprays into both nostrils daily.   levocetirizine 5 MG tablet Commonly known as:  XYZAL Take 1 tablet (5 mg total) by mouth every evening.   levocetirizine 5 MG tablet Commonly known as:  XYZAL Take 1 tablet (5 mg total) by mouth every evening.       Allergies  Allergen Reactions  . Ciprofloxacin   . Erythromycin   . Septra [Sulfamethoxazole-Trimethoprim]     I appreciate the opportunity to take part in [facility] care. Please do not hesitate to contact me with questions.  Sincerely,  Edmonia Lynch, MD

## 2019-01-07 NOTE — Assessment & Plan Note (Signed)
   Continue levocetirizine 5 mg daily as needed.  If needed, may step up to levocetirizine twice daily and adding famotidine (Pepcid) if needed.  To avoid diminishing benefit with daily use (tachyphylaxis) of second generation antihistamine, consider alternating every few months between fexofenadine (Allegra) and levocetirizine (Xyzal).

## 2019-01-07 NOTE — Patient Instructions (Addendum)
Recurrent urticaria  Continue levocetirizine 5 mg daily as needed.  If needed, may step up to levocetirizine twice daily and adding famotidine (Pepcid) if needed.  To avoid diminishing benefit with daily use (tachyphylaxis) of second generation antihistamine, consider alternating every few months between fexofenadine (Allegra) and levocetirizine (Xyzal).  Chronic rhinitis  A prescription has been provided for fluticasone nasal spray (Flonase), one spray per nostril 1-2 times daily as needed. Proper nasal spray technique has been discussed and demonstrated.  Nasal saline spray (i.e. Simply Saline) is recommended prior to medicated nasal sprays and as needed.   Return in about 6-12 months or sooner if needed.  Urticaria (Hives)  . Levocetirizine (Xyzal) 5 mg twice a day and famotidine (Pepcid) 20 mg twice a day. If no symptoms for 7-14 days then decrease to. . Levocetirizine (Xyzal) 5 mg twice a day and famotidine (Pepcid) 20 mg once a day.  If no symptoms for 7-14 days then decrease to. . Levocetirizine (Xyzal) 5 mg twice a day.  If no symptoms for 7-14 days then decrease to. . Levocetirizine (Xyzal) 5 mg once a day.  May use Benadryl (diphenhydramine) as needed for breakthrough symptoms       If symptoms return, then step up dosage

## 2019-03-01 DIAGNOSIS — S93491A Sprain of other ligament of right ankle, initial encounter: Secondary | ICD-10-CM | POA: Diagnosis not present

## 2019-03-26 DIAGNOSIS — N644 Mastodynia: Secondary | ICD-10-CM | POA: Diagnosis not present

## 2019-03-26 DIAGNOSIS — N6011 Diffuse cystic mastopathy of right breast: Secondary | ICD-10-CM | POA: Diagnosis not present

## 2019-05-31 ENCOUNTER — Telehealth: Payer: Self-pay | Admitting: *Deleted

## 2019-05-31 MED ORDER — LEVOCETIRIZINE DIHYDROCHLORIDE 5 MG PO TABS: 5.0000 mg | ORAL_TABLET | Freq: Every evening | ORAL | 1 refills | Status: DC

## 2019-05-31 NOTE — Telephone Encounter (Signed)
Refilled levocetirizine with 1 refill.

## 2019-06-25 ENCOUNTER — Other Ambulatory Visit: Payer: Self-pay | Admitting: Allergy and Immunology

## 2019-06-25 NOTE — Telephone Encounter (Signed)
Refilled levocetirizine x1. Pt needs apt for further refills.

## 2019-07-09 ENCOUNTER — Other Ambulatory Visit: Payer: Self-pay | Admitting: Allergy and Immunology

## 2021-08-02 DIAGNOSIS — Z1231 Encounter for screening mammogram for malignant neoplasm of breast: Secondary | ICD-10-CM | POA: Diagnosis not present

## 2021-08-02 DIAGNOSIS — N841 Polyp of cervix uteri: Secondary | ICD-10-CM | POA: Diagnosis not present

## 2021-08-02 DIAGNOSIS — Z6823 Body mass index (BMI) 23.0-23.9, adult: Secondary | ICD-10-CM | POA: Diagnosis not present

## 2021-08-02 DIAGNOSIS — Z01419 Encounter for gynecological examination (general) (routine) without abnormal findings: Secondary | ICD-10-CM | POA: Diagnosis not present

## 2021-08-03 DIAGNOSIS — N841 Polyp of cervix uteri: Secondary | ICD-10-CM | POA: Diagnosis not present

## 2021-08-03 DIAGNOSIS — Z13220 Encounter for screening for lipoid disorders: Secondary | ICD-10-CM | POA: Diagnosis not present

## 2021-08-03 DIAGNOSIS — Z0000 Encounter for general adult medical examination without abnormal findings: Secondary | ICD-10-CM | POA: Diagnosis not present

## 2021-08-03 DIAGNOSIS — D225 Melanocytic nevi of trunk: Secondary | ICD-10-CM | POA: Diagnosis not present

## 2021-08-03 DIAGNOSIS — L814 Other melanin hyperpigmentation: Secondary | ICD-10-CM | POA: Diagnosis not present

## 2021-08-03 DIAGNOSIS — L821 Other seborrheic keratosis: Secondary | ICD-10-CM | POA: Diagnosis not present

## 2021-08-03 DIAGNOSIS — L820 Inflamed seborrheic keratosis: Secondary | ICD-10-CM | POA: Diagnosis not present

## 2021-08-05 ENCOUNTER — Other Ambulatory Visit: Payer: Self-pay | Admitting: Obstetrics and Gynecology

## 2021-08-05 DIAGNOSIS — R928 Other abnormal and inconclusive findings on diagnostic imaging of breast: Secondary | ICD-10-CM

## 2021-08-13 ENCOUNTER — Other Ambulatory Visit: Payer: Self-pay

## 2021-08-13 ENCOUNTER — Ambulatory Visit
Admission: RE | Admit: 2021-08-13 | Discharge: 2021-08-13 | Disposition: A | Discharge status: Home / Self Care | Payer: BC Managed Care – PPO | Source: Ambulatory Visit | Attending: Obstetrics and Gynecology | Admitting: Obstetrics and Gynecology

## 2021-08-13 ENCOUNTER — Ambulatory Visit
Admission: RE | Admit: 2021-08-13 | Discharge: 2021-08-13 | Disposition: A | Discharge status: Home / Self Care | Payer: BLUE CROSS/BLUE SHIELD | Source: Ambulatory Visit | Attending: Obstetrics and Gynecology | Admitting: Obstetrics and Gynecology

## 2021-08-13 DIAGNOSIS — N6321 Unspecified lump in the left breast, upper outer quadrant: Secondary | ICD-10-CM | POA: Diagnosis not present

## 2021-08-13 DIAGNOSIS — R928 Other abnormal and inconclusive findings on diagnostic imaging of breast: Secondary | ICD-10-CM

## 2021-08-13 DIAGNOSIS — R922 Inconclusive mammogram: Secondary | ICD-10-CM | POA: Diagnosis not present

## 2022-01-27 DIAGNOSIS — D123 Benign neoplasm of transverse colon: Secondary | ICD-10-CM | POA: Diagnosis not present

## 2022-01-27 DIAGNOSIS — K644 Residual hemorrhoidal skin tags: Secondary | ICD-10-CM | POA: Diagnosis not present

## 2022-01-27 DIAGNOSIS — Z1211 Encounter for screening for malignant neoplasm of colon: Secondary | ICD-10-CM | POA: Diagnosis not present

## 2022-01-27 DIAGNOSIS — K648 Other hemorrhoids: Secondary | ICD-10-CM | POA: Diagnosis not present

## 2022-01-27 DIAGNOSIS — K5730 Diverticulosis of large intestine without perforation or abscess without bleeding: Secondary | ICD-10-CM | POA: Diagnosis not present

## 2022-08-08 DIAGNOSIS — Z01419 Encounter for gynecological examination (general) (routine) without abnormal findings: Secondary | ICD-10-CM | POA: Diagnosis not present

## 2022-08-08 DIAGNOSIS — Z6823 Body mass index (BMI) 23.0-23.9, adult: Secondary | ICD-10-CM | POA: Diagnosis not present

## 2022-08-08 DIAGNOSIS — Z124 Encounter for screening for malignant neoplasm of cervix: Secondary | ICD-10-CM | POA: Diagnosis not present

## 2022-08-08 DIAGNOSIS — Z1231 Encounter for screening mammogram for malignant neoplasm of breast: Secondary | ICD-10-CM | POA: Diagnosis not present

## 2022-08-09 ENCOUNTER — Other Ambulatory Visit: Payer: Self-pay | Admitting: Obstetrics and Gynecology

## 2022-08-09 DIAGNOSIS — N6002 Solitary cyst of left breast: Secondary | ICD-10-CM

## 2022-08-11 ENCOUNTER — Ambulatory Visit
Admission: RE | Admit: 2022-08-11 | Discharge: 2022-08-11 | Disposition: A | Discharge status: Home / Self Care | Payer: BC Managed Care – PPO | Source: Ambulatory Visit | Attending: Obstetrics and Gynecology | Admitting: Obstetrics and Gynecology

## 2022-08-11 DIAGNOSIS — N6002 Solitary cyst of left breast: Secondary | ICD-10-CM

## 2022-08-11 DIAGNOSIS — N6321 Unspecified lump in the left breast, upper outer quadrant: Secondary | ICD-10-CM | POA: Diagnosis not present

## 2022-09-07 DIAGNOSIS — Z0000 Encounter for general adult medical examination without abnormal findings: Secondary | ICD-10-CM | POA: Diagnosis not present

## 2022-09-07 DIAGNOSIS — Z13220 Encounter for screening for lipoid disorders: Secondary | ICD-10-CM | POA: Diagnosis not present

## 2022-09-07 DIAGNOSIS — Z23 Encounter for immunization: Secondary | ICD-10-CM | POA: Diagnosis not present

## 2022-10-05 DIAGNOSIS — L700 Acne vulgaris: Secondary | ICD-10-CM | POA: Diagnosis not present

## 2022-10-05 DIAGNOSIS — L720 Epidermal cyst: Secondary | ICD-10-CM | POA: Diagnosis not present

## 2022-10-05 DIAGNOSIS — L738 Other specified follicular disorders: Secondary | ICD-10-CM | POA: Diagnosis not present

## 2023-02-07 DIAGNOSIS — M9901 Segmental and somatic dysfunction of cervical region: Secondary | ICD-10-CM | POA: Diagnosis not present

## 2023-02-07 DIAGNOSIS — M9903 Segmental and somatic dysfunction of lumbar region: Secondary | ICD-10-CM | POA: Diagnosis not present

## 2023-02-07 DIAGNOSIS — M9905 Segmental and somatic dysfunction of pelvic region: Secondary | ICD-10-CM | POA: Diagnosis not present

## 2023-02-07 DIAGNOSIS — M9902 Segmental and somatic dysfunction of thoracic region: Secondary | ICD-10-CM | POA: Diagnosis not present

## 2023-02-09 DIAGNOSIS — L814 Other melanin hyperpigmentation: Secondary | ICD-10-CM | POA: Diagnosis not present

## 2023-02-09 DIAGNOSIS — L578 Other skin changes due to chronic exposure to nonionizing radiation: Secondary | ICD-10-CM | POA: Diagnosis not present

## 2023-02-09 DIAGNOSIS — D1801 Hemangioma of skin and subcutaneous tissue: Secondary | ICD-10-CM | POA: Diagnosis not present

## 2023-02-09 DIAGNOSIS — L821 Other seborrheic keratosis: Secondary | ICD-10-CM | POA: Diagnosis not present

## 2023-02-09 DIAGNOSIS — L570 Actinic keratosis: Secondary | ICD-10-CM | POA: Diagnosis not present

## 2023-03-30 IMAGING — MG MM DIGITAL DIAGNOSTIC UNILAT*L* W/ TOMO W/ CAD
4 series · 4 of 12 positions shown · non-contrast
Comparison: Previous exam(s).

CLINICAL DATA: 45-year-old female for further evaluation of
possible LEFT breast mass on screening mammogram.

EXAM:
DIGITAL DIAGNOSTIC UNILATERAL LEFT MAMMOGRAM WITH TOMOSYNTHESIS AND
CAD; ULTRASOUND LEFT BREAST LIMITED
TECHNIQUE: Left digital diagnostic mammography and breast tomosynthesis was
performed. The images were evaluated with computer-aided detection.;
Targeted ultrasound examination of the left breast was performed.

[L MLO synth-2D]
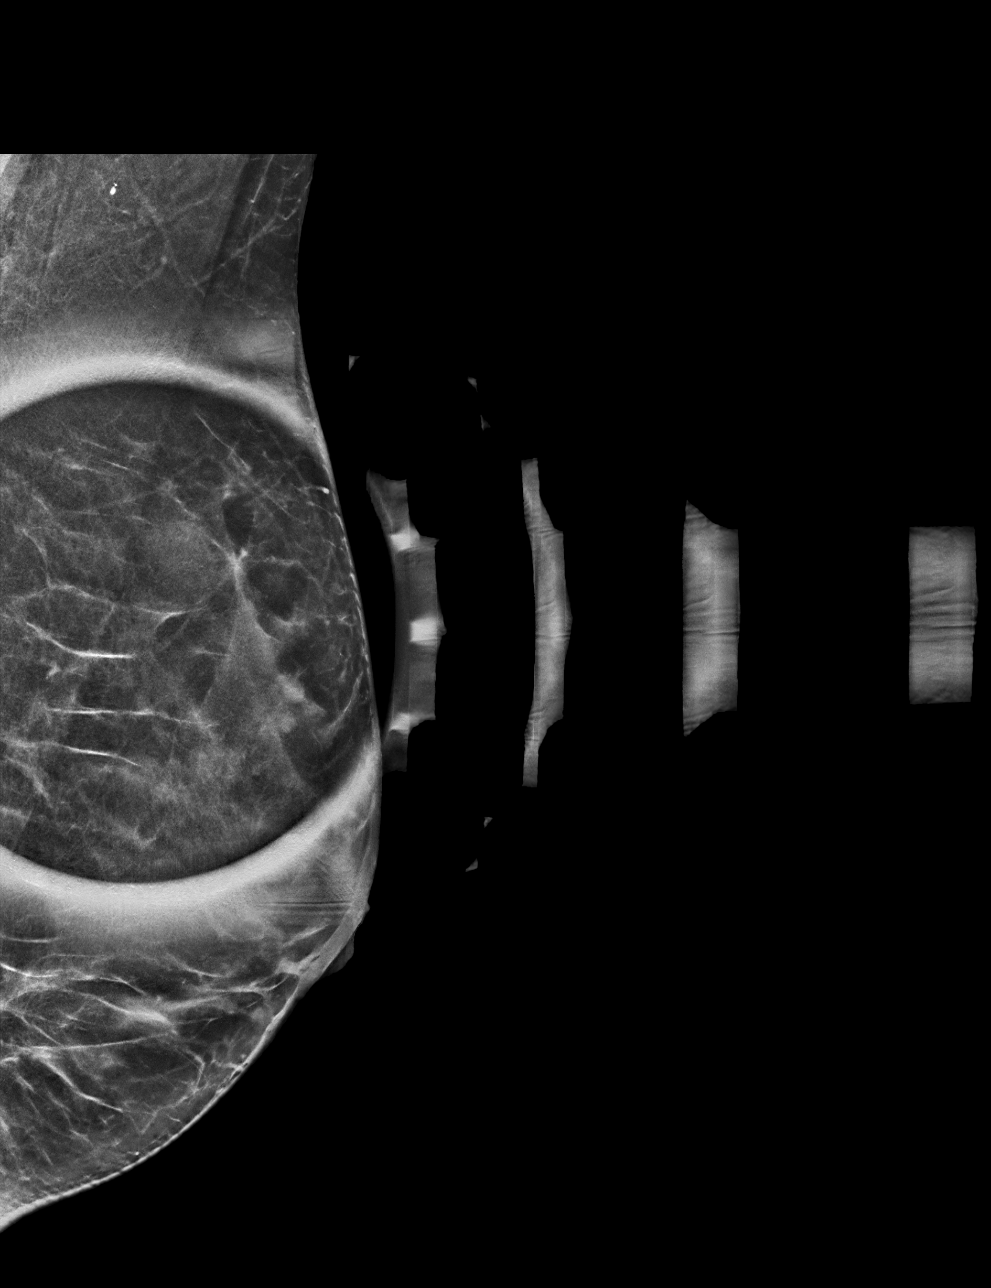

[L CC synth-2D]
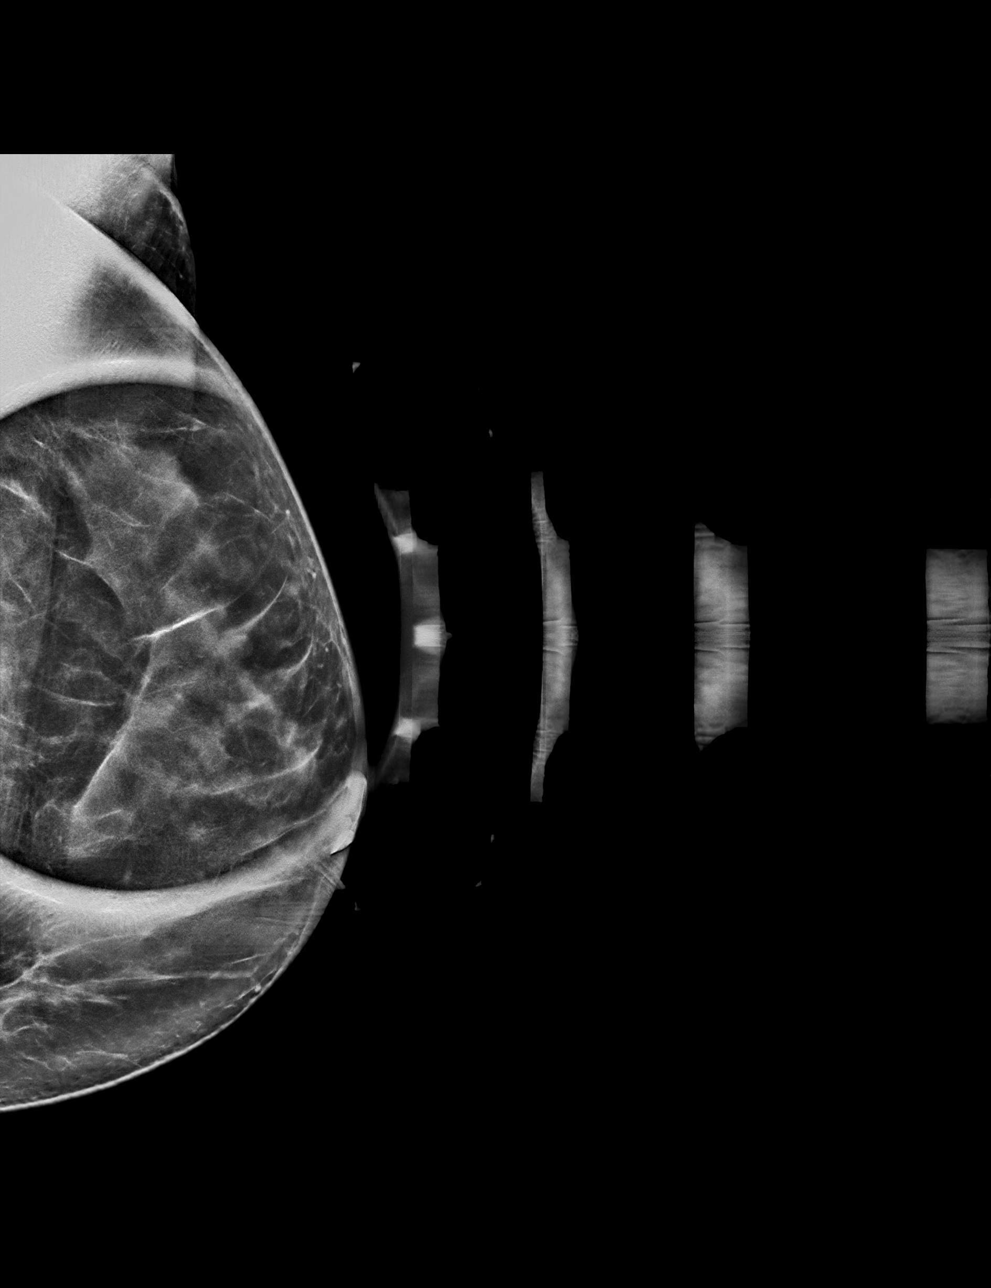

[L CC tomo · tomo slice 33/64.0]
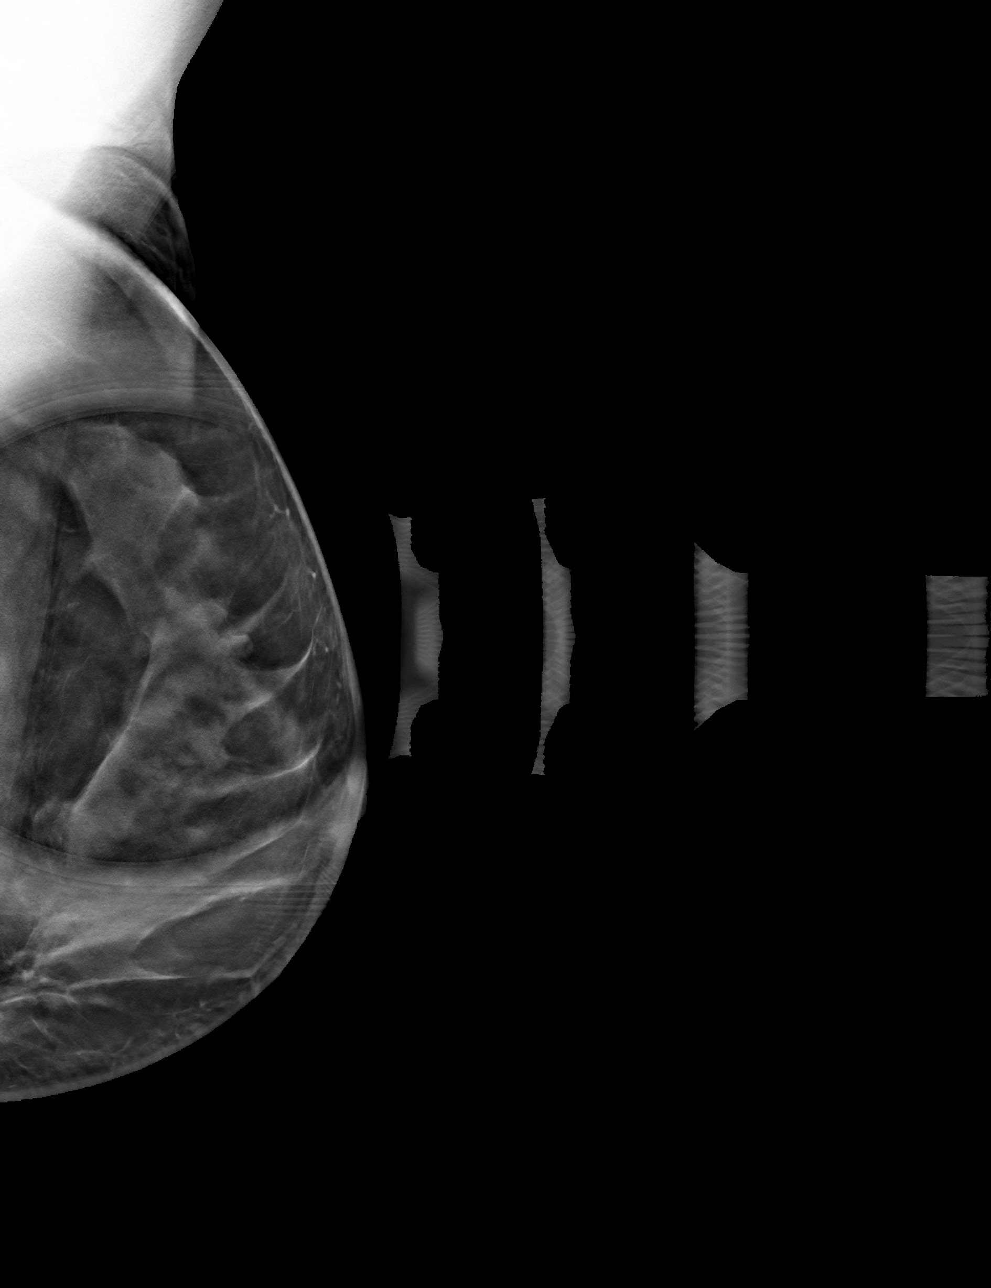

[L MLO tomo · tomo slice 27/54.0]
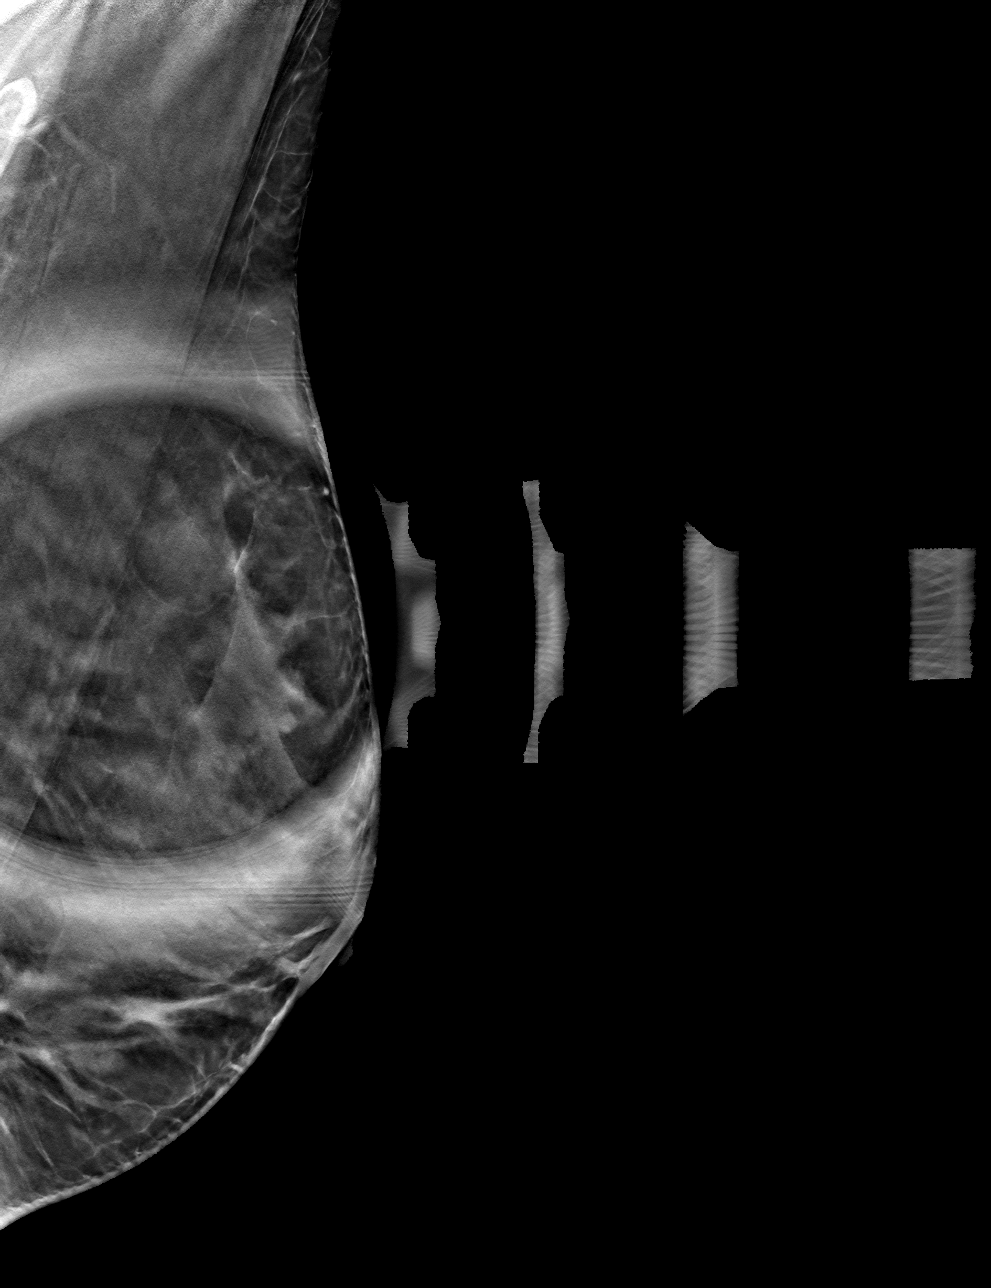

[4 of 12 positions shown; findings below may reference images not displayed]

ACR Breast Density Category c: The breast tissue is heterogeneously
dense, which may obscure small masses.
FINDINGS: 2D/3D spot compression views of the LEFT breast demonstrate a
persistent circumscribed oval mass within the UPPER-OUTER LEFT
breast.

Targeted ultrasound is performed, showing a 1.5 x 0.7 x 1.7 cm
benign simple cyst at the 1 o'clock position of the LEFT breast 7 cm
from the nipple, corresponding to the screening study finding.
IMPRESSION: Benign cyst within the UPPER-OUTER LEFT breast corresponding to the
screening study finding.

RECOMMENDATION:
Bilateral screening mammogram in 1 year.

I have discussed the findings and recommendations with the patient.
If applicable, a reminder letter will be sent to the patient
regarding the next appointment.

BI-RADS CATEGORY  2: Benign.

## 2023-03-30 IMAGING — US US BREAST*L* LIMITED INC AXILLA
1 series · 10 of 10 positions shown · non-contrast
Comparison: Previous exam(s).

CLINICAL DATA: 45-year-old female for further evaluation of
possible LEFT breast mass on screening mammogram.

EXAM:
DIGITAL DIAGNOSTIC UNILATERAL LEFT MAMMOGRAM WITH TOMOSYNTHESIS AND
CAD; ULTRASOUND LEFT BREAST LIMITED
TECHNIQUE: Left digital diagnostic mammography and breast tomosynthesis was
performed. The images were evaluated with computer-aided detection.;
Targeted ultrasound examination of the left breast was performed.

[Series 1: us breast*left* limited inc axilla · 0.06mm/px · 10 of 10 slices shown]
[im 1/10]
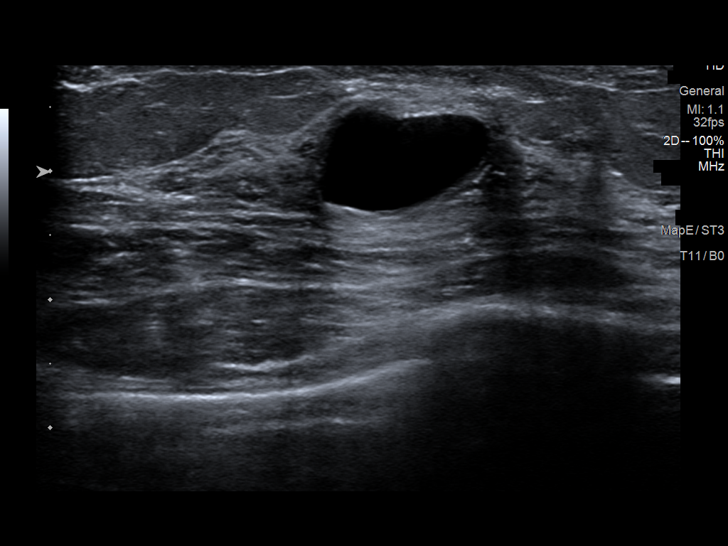
[im 2/10]
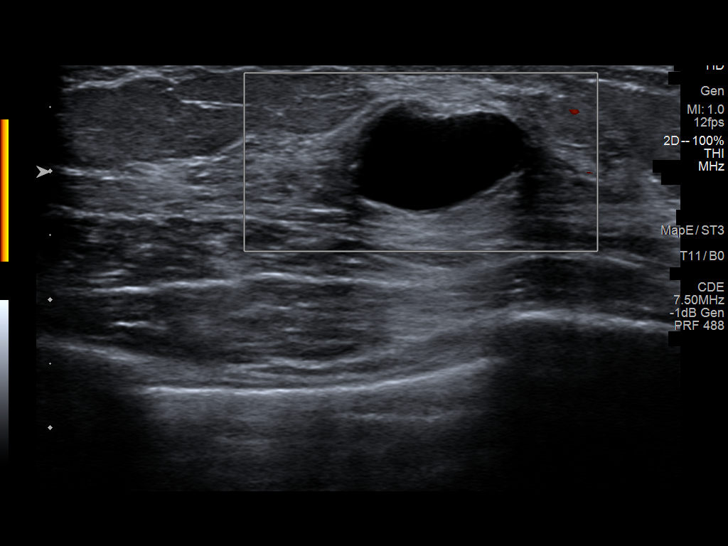
[im 3/10]
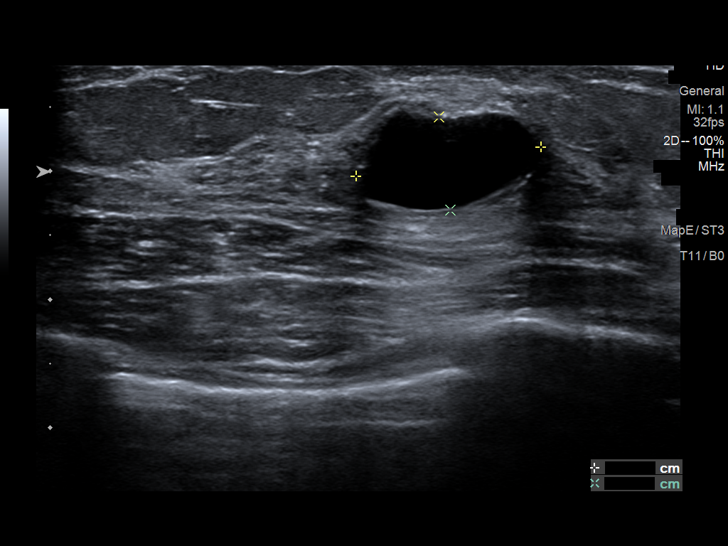
[im 4/10]
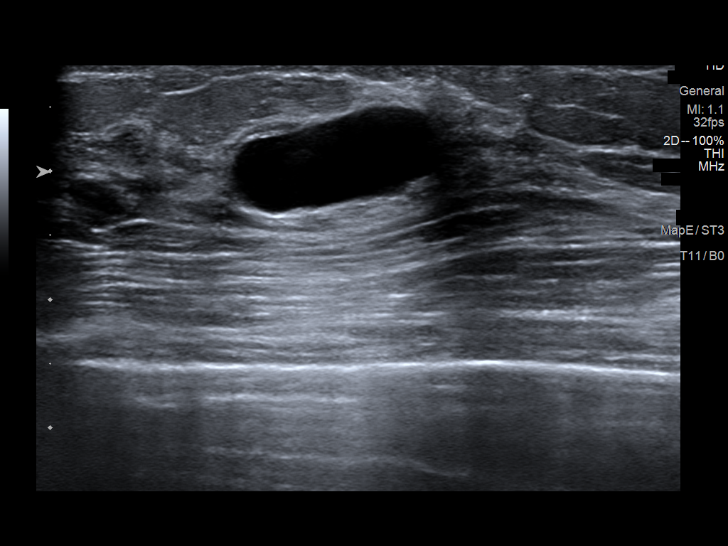
[im 5/10]
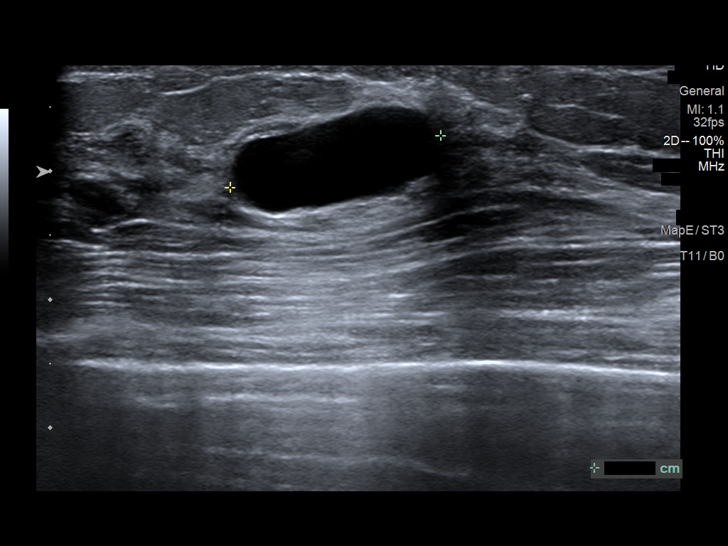
[im 6/10]
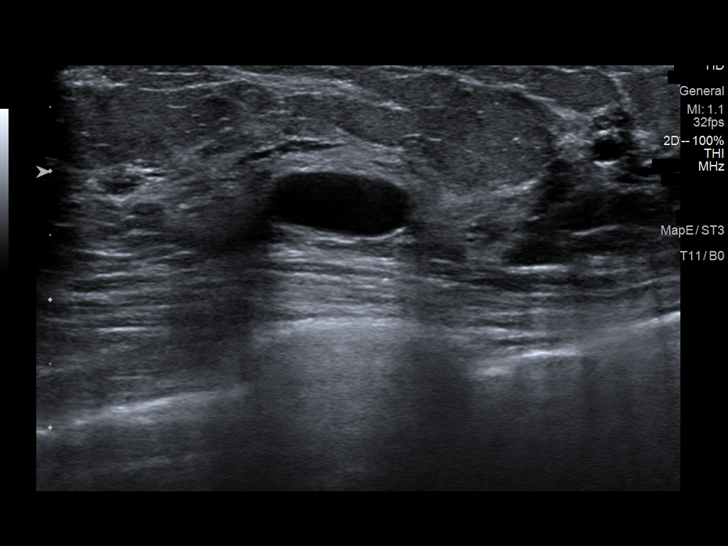
[im 7/10]
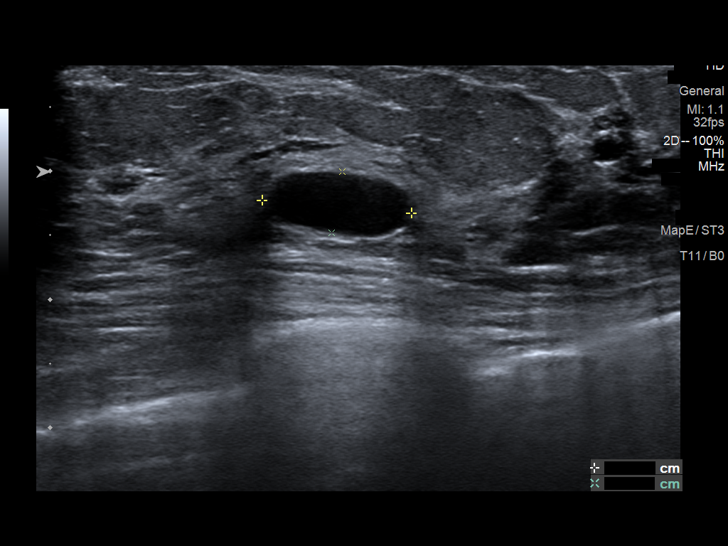
[im 8/10]
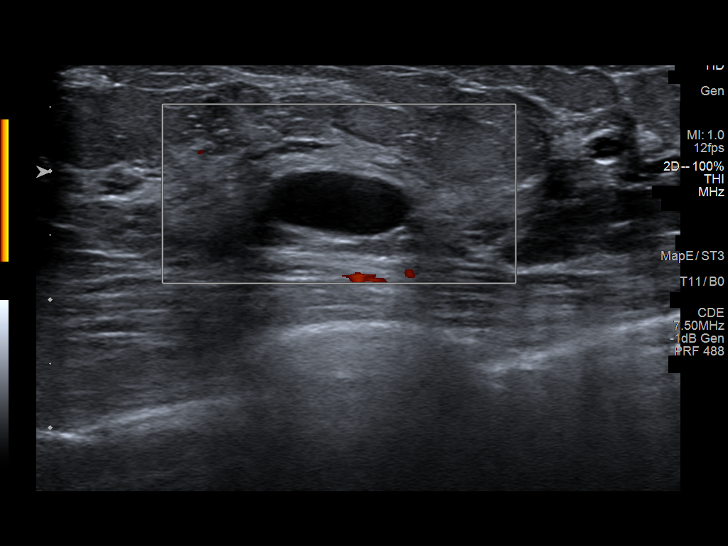
[im 9/10]
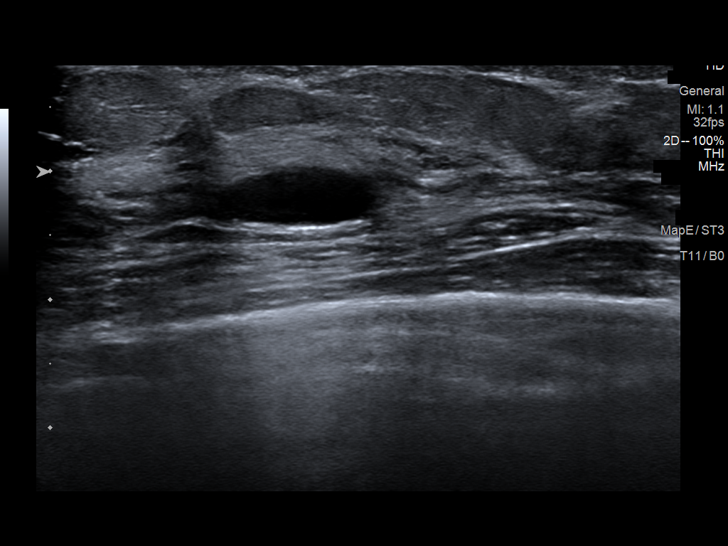
[im 10/10]
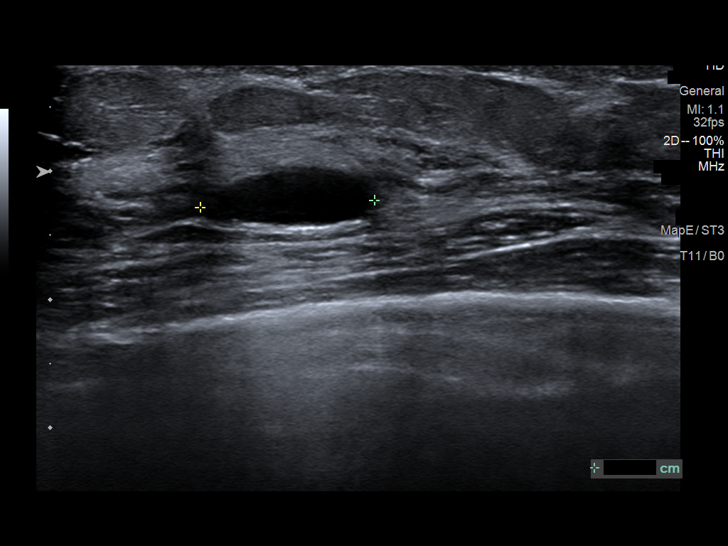

[10 of 10 positions shown; findings below may reference images not displayed]

ACR Breast Density Category c: The breast tissue is heterogeneously
dense, which may obscure small masses.
FINDINGS: 2D/3D spot compression views of the LEFT breast demonstrate a
persistent circumscribed oval mass within the UPPER-OUTER LEFT
breast.

Targeted ultrasound is performed, showing a 1.5 x 0.7 x 1.7 cm
benign simple cyst at the 1 o'clock position of the LEFT breast 7 cm
from the nipple, corresponding to the screening study finding.
IMPRESSION: Benign cyst within the UPPER-OUTER LEFT breast corresponding to the
screening study finding.

RECOMMENDATION:
Bilateral screening mammogram in 1 year.

I have discussed the findings and recommendations with the patient.
If applicable, a reminder letter will be sent to the patient
regarding the next appointment.

BI-RADS CATEGORY  2: Benign.

## 2023-08-11 ENCOUNTER — Other Ambulatory Visit: Payer: Self-pay | Admitting: Obstetrics and Gynecology

## 2023-08-11 DIAGNOSIS — N6310 Unspecified lump in the right breast, unspecified quadrant: Secondary | ICD-10-CM

## 2023-09-06 ENCOUNTER — Ambulatory Visit
Admission: RE | Admit: 2023-09-06 | Discharge: 2023-09-06 | Disposition: A | Discharge status: Home / Self Care | Payer: 59 | Source: Ambulatory Visit | Attending: Obstetrics and Gynecology | Admitting: Obstetrics and Gynecology

## 2023-09-06 DIAGNOSIS — N6310 Unspecified lump in the right breast, unspecified quadrant: Secondary | ICD-10-CM

## 2023-10-16 ENCOUNTER — Other Ambulatory Visit: Payer: Self-pay | Admitting: Obstetrics and Gynecology

## 2023-10-16 DIAGNOSIS — N6310 Unspecified lump in the right breast, unspecified quadrant: Secondary | ICD-10-CM

## 2023-10-19 ENCOUNTER — Ambulatory Visit
Admission: RE | Admit: 2023-10-19 | Discharge: 2023-10-19 | Disposition: A | Discharge status: Home / Self Care | Payer: BC Managed Care – PPO | Source: Ambulatory Visit | Attending: Obstetrics and Gynecology | Admitting: Obstetrics and Gynecology

## 2023-10-19 ENCOUNTER — Inpatient Hospital Stay
Admission: RE | Admit: 2023-10-19 | Discharge: 2023-10-19 | Discharge status: Home / Self Care | Payer: BC Managed Care – PPO | Source: Ambulatory Visit | Attending: Obstetrics and Gynecology | Admitting: Obstetrics and Gynecology

## 2023-10-19 DIAGNOSIS — N6310 Unspecified lump in the right breast, unspecified quadrant: Secondary | ICD-10-CM
